# Patient Record
Sex: Female | Born: 1950 | Race: White | Hispanic: No | Smoking: Never smoker
Health system: Southern US, Community
[De-identification: ages and names within clinical notes are randomized; demographics above are authoritative.]

## PROBLEM LIST (undated history)

## (undated) DIAGNOSIS — N189 Chronic kidney disease, unspecified: Secondary | ICD-10-CM

## (undated) DIAGNOSIS — T7840XA Allergy, unspecified, initial encounter: Secondary | ICD-10-CM

## (undated) DIAGNOSIS — F419 Anxiety disorder, unspecified: Secondary | ICD-10-CM

## (undated) DIAGNOSIS — E049 Nontoxic goiter, unspecified: Secondary | ICD-10-CM

## (undated) DIAGNOSIS — R0902 Hypoxemia: Secondary | ICD-10-CM

## (undated) DIAGNOSIS — M1990 Unspecified osteoarthritis, unspecified site: Secondary | ICD-10-CM

## (undated) DIAGNOSIS — C801 Malignant (primary) neoplasm, unspecified: Secondary | ICD-10-CM

## (undated) DIAGNOSIS — I10 Essential (primary) hypertension: Secondary | ICD-10-CM

## (undated) HISTORY — PX: BREAST SURGERY: SHX581

## (undated) HISTORY — PX: ABDOMINAL HYSTERECTOMY: SHX81

## (undated) HISTORY — PX: ANTERIOR CERVICAL DECOMP/DISCECTOMY FUSION: SHX1161

## (undated) HISTORY — PX: TONSILLECTOMY: SUR1361

## (undated) HISTORY — PX: EYE SURGERY: SHX253

## (undated) HISTORY — PX: CHOLECYSTECTOMY: SHX55

## (undated) HISTORY — DX: Allergy, unspecified, initial encounter: T78.40XA

## (undated) HISTORY — PX: HERNIA REPAIR: SHX51

## (undated) HISTORY — PX: JOINT REPLACEMENT: SHX530

## (undated) HISTORY — DX: Hypoxemia: R09.02

## (undated) HISTORY — PX: SPINE SURGERY: SHX786

---

## 2003-12-29 HISTORY — PX: LAPAROSCOPIC ROUX-EN-Y GASTRIC BYPASS WITH HIATAL HERNIA REPAIR: SHX6513

## 2012-12-28 HISTORY — PX: LAPAROSCOPIC NEPHRECTOMY: SUR781

## 2014-09-28 ENCOUNTER — Ambulatory Visit: Payer: Self-pay | Admitting: Orthopedic Surgery

## 2017-06-15 ENCOUNTER — Other Ambulatory Visit: Payer: Self-pay | Admitting: Orthopedic Surgery

## 2017-06-15 DIAGNOSIS — M1712 Unilateral primary osteoarthritis, left knee: Secondary | ICD-10-CM

## 2017-06-24 ENCOUNTER — Ambulatory Visit
Admission: RE | Admit: 2017-06-24 | Discharge: 2017-06-24 | Disposition: A | Discharge status: Home / Self Care | Payer: BC Managed Care – PPO | Source: Ambulatory Visit | Attending: Orthopedic Surgery | Admitting: Orthopedic Surgery

## 2017-06-24 DIAGNOSIS — M25852 Other specified joint disorders, left hip: Secondary | ICD-10-CM | POA: Diagnosis not present

## 2017-06-24 DIAGNOSIS — M1712 Unilateral primary osteoarthritis, left knee: Secondary | ICD-10-CM

## 2017-07-07 ENCOUNTER — Encounter: Payer: Self-pay | Admitting: *Deleted

## 2017-07-07 ENCOUNTER — Encounter
Admission: RE | Admit: 2017-07-07 | Discharge: 2017-07-07 | Disposition: A | Discharge status: Home / Self Care | Payer: Medicare Other | Source: Ambulatory Visit | Attending: Orthopedic Surgery | Admitting: Orthopedic Surgery

## 2017-07-07 DIAGNOSIS — Z01810 Encounter for preprocedural cardiovascular examination: Secondary | ICD-10-CM | POA: Insufficient documentation

## 2017-07-07 DIAGNOSIS — I10 Essential (primary) hypertension: Secondary | ICD-10-CM | POA: Diagnosis present

## 2017-07-07 DIAGNOSIS — Z01812 Encounter for preprocedural laboratory examination: Secondary | ICD-10-CM | POA: Insufficient documentation

## 2017-07-07 HISTORY — DX: Nontoxic goiter, unspecified: E04.9

## 2017-07-07 HISTORY — DX: Malignant (primary) neoplasm, unspecified: C80.1

## 2017-07-07 HISTORY — DX: Chronic kidney disease, unspecified: N18.9

## 2017-07-07 HISTORY — DX: Essential (primary) hypertension: I10

## 2017-07-07 HISTORY — DX: Unspecified osteoarthritis, unspecified site: M19.90

## 2017-07-07 HISTORY — DX: Anxiety disorder, unspecified: F41.9

## 2017-07-07 LAB — BASIC METABOLIC PANEL
Anion gap: 9 (ref 5–15)
BUN: 18 mg/dL (ref 6–20)
CHLORIDE: 107 mmol/L (ref 101–111)
CO2: 24 mmol/L (ref 22–32)
CREATININE: 1.04 mg/dL — AB (ref 0.44–1.00)
Calcium: 9.3 mg/dL (ref 8.9–10.3)
GFR calc Af Amer: >60 mL/min (ref >60)
GFR calc non Af Amer: 56 mL/min — ABNORMAL LOW (ref >60)
Glucose, Bld: 84 mg/dL (ref 65–99)
Potassium: 3.5 mmol/L (ref 3.5–5.1)
Sodium: 140 mmol/L (ref 135–145)

## 2017-07-07 LAB — URINALYSIS, COMPLETE (UACMP) WITH MICROSCOPIC
Bacteria, UA: NONE SEEN
Bilirubin Urine: NEGATIVE
GLUCOSE, UA: 150 mg/dL — AB
Hgb urine dipstick: NEGATIVE
Ketones, ur: NEGATIVE mg/dL
Leukocytes, UA: NEGATIVE
Nitrite: NEGATIVE
PH: 5 (ref 5.0–8.0)
PROTEIN: NEGATIVE mg/dL
SPECIFIC GRAVITY, URINE: 1.023 (ref 1.005–1.030)

## 2017-07-07 LAB — CBC
HEMATOCRIT: 39.5 % (ref 35.0–47.0)
HEMOGLOBIN: 13.5 g/dL (ref 12.0–16.0)
MCH: 30.5 pg (ref 26.0–34.0)
MCHC: 34.3 g/dL (ref 32.0–36.0)
MCV: 89 fL (ref 80.0–100.0)
Platelets: 363 10*3/uL (ref 150–440)
RBC: 4.44 MIL/uL (ref 3.80–5.20)
RDW: 13.5 % (ref 11.5–14.5)
WBC: 6.7 10*3/uL (ref 3.6–11.0)

## 2017-07-07 LAB — SURGICAL PCR SCREEN
MRSA, PCR: NEGATIVE
Staphylococcus aureus: NEGATIVE

## 2017-07-07 LAB — PROTIME-INR
INR: 0.96
Prothrombin Time: 12.8 seconds (ref 11.4–15.2)

## 2017-07-07 LAB — SEDIMENTATION RATE: SED RATE: 23 mm/h (ref 0–30)

## 2017-07-07 LAB — TYPE AND SCREEN
ABO/RH(D): O POS
ANTIBODY SCREEN: NEGATIVE

## 2017-07-07 LAB — APTT: aPTT: 29 seconds (ref 24–36)

## 2017-07-07 NOTE — Patient Instructions (Signed)
  Your procedure is scheduled on: July 20, 2017 [facility]) Report to Same Day Surgery 2nd floor medical mall ([facility] Entrance-take elevator on left to 2nd floor.  Check in with surgery information desk.) To find out your arrival time please call [phone] between 1PM - 3PM on July 19, 2017 Monday) Remember: Instructions that are not followed completely may result in serious medical risk, up to and including death, or upon the discretion of your surgeon and anesthesiologist your surgery may need to be rescheduled.    _x___ 1. Do not eat food or drink liquids after midnight. No gum chewing or  hard candies                               __x__ 2. No Alcohol for 24 hours before or after surgery.   __x__3. No Smoking for 24 prior to surgery.   ____  4. Bring all medications with you on the day of surgery if instructed.    __x__ 5. Notify your doctor if there is any change in your medical condition     (cold, fever, infections).     Do not wear jewelry, make-up, hairpins, clips or nail polish.  Do not wear lotions, powders, or perfumes.   Do not shave 48 hours prior to surgery. Men may shave face and neck.  Do not bring valuables to the hospital.    [facility] is not responsible for any belongings or valuables.               Contacts, dentures or bridgework may not be worn into surgery.  Leave your suitcase in the car. After surgery it may be brought to your room.  For patients admitted to the hospital, discharge time is determined by your treatment team                    Patients discharged the day of surgery will not be allowed to drive home.  You will need someone to drive you home and stay with you the night of your procedure.    Please read over the following fact sheets that you were given:   [facility] Preparing for Surgery and or MRSA Information   _x___ Take the following medications the morning of surgery with a sip of water :  1. AMLODIPINE  2. BUSPIRONE  3.  LISINOPRIL  4. METOPROLOL      ____Fleets enema or Magnesium Citrate as directed.   _x___ Use CHG Soap or sage wipes as directed on instruction sheet   ____ Use inhalers on the day of surgery and bring to hospital day of surgery  ____ Stop Metformin and Janumet 2 days prior to surgery.    ____ Take 1/2 of usual insulin dose the night before surgery and none on the morning surgery      _x___ Follow recommendations from Cardiologist, Pulmonologist or PCP regarding          stopping Aspirin, Coumadin, Plavix ,Eliquis, Effient, or Pradaxa, and Pletal.  X____Stop Anti-inflammatories such as Advil, Aleve, Ibuprofen, Motrin, Naproxen, Naprosyn, Goodies powders or aspirin products. OK to take Tylenol    _x___ Stop supplements until after surgery.  But may continue Vitamin D, Vitamin B,and multivitamin          ____ Bring C-Pap to the hospital.

## 2017-07-09 LAB — URINE CULTURE: Culture: <10000 — AB

## 2017-07-19 MED ORDER — TRANEXAMIC ACID 1000 MG/10ML IV SOLN
1000.0000 mg | INTRAVENOUS | Status: DC
  Filled 2017-07-19: qty 10

## 2017-07-19 MED ORDER — CLINDAMYCIN PHOSPHATE 900 MG/50ML IV SOLN
900.0000 mg | Freq: Once | INTRAVENOUS | Status: AC
  Administered 2017-07-20: 900 mg via INTRAVENOUS

## 2017-07-20 ENCOUNTER — Inpatient Hospital Stay: Payer: Medicare Other | Admitting: Anesthesiology

## 2017-07-20 ENCOUNTER — Inpatient Hospital Stay: Payer: Medicare Other

## 2017-07-20 ENCOUNTER — Encounter
Admission: RE | Disposition: A | Discharge status: Home / Self Care | Payer: Self-pay | Source: Ambulatory Visit | Attending: Orthopedic Surgery

## 2017-07-20 ENCOUNTER — Inpatient Hospital Stay
Admission: RE | Admit: 2017-07-20 | Discharge: 2017-07-23 | DRG: 470 | Disposition: A | Discharge status: Home / Self Care | Payer: Medicare Other | Source: Ambulatory Visit | Attending: Orthopedic Surgery | Admitting: Orthopedic Surgery

## 2017-07-20 DIAGNOSIS — Z9049 Acquired absence of other specified parts of digestive tract: Secondary | ICD-10-CM | POA: Diagnosis not present

## 2017-07-20 DIAGNOSIS — Z905 Acquired absence of kidney: Secondary | ICD-10-CM | POA: Diagnosis not present

## 2017-07-20 DIAGNOSIS — Z85528 Personal history of other malignant neoplasm of kidney: Secondary | ICD-10-CM

## 2017-07-20 DIAGNOSIS — G8918 Other acute postprocedural pain: Secondary | ICD-10-CM

## 2017-07-20 DIAGNOSIS — Z981 Arthrodesis status: Secondary | ICD-10-CM | POA: Diagnosis not present

## 2017-07-20 DIAGNOSIS — M1712 Unilateral primary osteoarthritis, left knee: Principal | ICD-10-CM | POA: Diagnosis present

## 2017-07-20 DIAGNOSIS — Z825 Family history of asthma and other chronic lower respiratory diseases: Secondary | ICD-10-CM | POA: Diagnosis not present

## 2017-07-20 DIAGNOSIS — Z90710 Acquired absence of both cervix and uterus: Secondary | ICD-10-CM

## 2017-07-20 DIAGNOSIS — I129 Hypertensive chronic kidney disease with stage 1 through stage 4 chronic kidney disease, or unspecified chronic kidney disease: Secondary | ICD-10-CM | POA: Diagnosis present

## 2017-07-20 DIAGNOSIS — Z8249 Family history of ischemic heart disease and other diseases of the circulatory system: Secondary | ICD-10-CM | POA: Diagnosis not present

## 2017-07-20 DIAGNOSIS — Z880 Allergy status to penicillin: Secondary | ICD-10-CM

## 2017-07-20 DIAGNOSIS — N189 Chronic kidney disease, unspecified: Secondary | ICD-10-CM | POA: Diagnosis present

## 2017-07-20 DIAGNOSIS — Z9889 Other specified postprocedural states: Secondary | ICD-10-CM | POA: Diagnosis not present

## 2017-07-20 HISTORY — PX: TOTAL KNEE ARTHROPLASTY: SHX125

## 2017-07-20 LAB — CBC
HEMATOCRIT: 35 % (ref 35.0–47.0)
Hemoglobin: 11.9 g/dL — ABNORMAL LOW (ref 12.0–16.0)
MCH: 30.3 pg (ref 26.0–34.0)
MCHC: 34.1 g/dL (ref 32.0–36.0)
MCV: 88.8 fL (ref 80.0–100.0)
PLATELETS: 256 10*3/uL (ref 150–440)
RBC: 3.94 MIL/uL (ref 3.80–5.20)
RDW: 13.3 % (ref 11.5–14.5)
WBC: 11 10*3/uL (ref 3.6–11.0)

## 2017-07-20 LAB — CREATININE, SERUM
Creatinine, Ser: 0.9 mg/dL (ref 0.44–1.00)
GFR calc Af Amer: >60 mL/min (ref >60)
GFR calc non Af Amer: >60 mL/min (ref >60)

## 2017-07-20 LAB — ABO/RH: ABO/RH(D): O POS

## 2017-07-20 SURGERY — ARTHROPLASTY, KNEE, TOTAL
Anesthesia: Spinal | Site: Knee | Laterality: Left | Wound class: Clean

## 2017-07-20 MED ORDER — MORPHINE SULFATE 10 MG/ML IJ SOLN
INTRAMUSCULAR | Status: DC | PRN
Start: 2017-07-20 — End: 2017-07-20
  Administered 2017-07-20: 10 mg

## 2017-07-20 MED ORDER — LISINOPRIL 10 MG PO TABS
10.0000 mg | ORAL_TABLET | Freq: Every day | ORAL | Status: DC
  Administered 2017-07-21 – 2017-07-22 (×2): 10 mg via ORAL
  Filled 2017-07-20 (×2): qty 1

## 2017-07-20 MED ORDER — DIPHENHYDRAMINE HCL 12.5 MG/5ML PO ELIX: 12.5000 mg | ORAL_SOLUTION | ORAL | Status: DC | PRN

## 2017-07-20 MED ORDER — LIDOCAINE HCL (PF) 2 % IJ SOLN
INTRAMUSCULAR | Status: AC
  Filled 2017-07-20: qty 2

## 2017-07-20 MED ORDER — ACETAMINOPHEN 650 MG RE SUPP: 650.0000 mg | Freq: Four times a day (QID) | RECTAL | Status: DC | PRN

## 2017-07-20 MED ORDER — SODIUM CHLORIDE 0.9 % IV BOLUS (SEPSIS): 500.0000 mL | Freq: Once | INTRAVENOUS | Status: DC

## 2017-07-20 MED ORDER — DOCUSATE SODIUM 100 MG PO CAPS
100.0000 mg | ORAL_CAPSULE | Freq: Two times a day (BID) | ORAL | Status: DC
  Administered 2017-07-20 – 2017-07-23 (×6): 100 mg via ORAL
  Filled 2017-07-20 (×6): qty 1

## 2017-07-20 MED ORDER — METOPROLOL SUCCINATE ER 50 MG PO TB24
50.0000 mg | ORAL_TABLET | Freq: Two times a day (BID) | ORAL | Status: DC
  Administered 2017-07-20 – 2017-07-22 (×5): 50 mg via ORAL
  Filled 2017-07-20 (×5): qty 1

## 2017-07-20 MED ORDER — METOCLOPRAMIDE HCL 10 MG PO TABS: 5.0000 mg | ORAL_TABLET | Freq: Three times a day (TID) | ORAL | Status: DC | PRN

## 2017-07-20 MED ORDER — OXYCODONE HCL 5 MG PO TABS
5.0000 mg | ORAL_TABLET | ORAL | Status: DC | PRN
  Administered 2017-07-20: 10 mg via ORAL
  Administered 2017-07-20 (×2): 5 mg via ORAL
  Administered 2017-07-20 – 2017-07-21 (×2): 10 mg via ORAL
  Administered 2017-07-21: 5 mg via ORAL
  Administered 2017-07-21 – 2017-07-23 (×10): 10 mg via ORAL
  Filled 2017-07-20 (×5): qty 2
  Filled 2017-07-20: qty 1
  Filled 2017-07-20 (×3): qty 2
  Filled 2017-07-20: qty 1
  Filled 2017-07-20 (×5): qty 2
  Filled 2017-07-20: qty 1

## 2017-07-20 MED ORDER — BUPIVACAINE LIPOSOME 1.3 % IJ SUSP
INTRAMUSCULAR | Status: AC
  Filled 2017-07-20: qty 20

## 2017-07-20 MED ORDER — BISACODYL 10 MG RE SUPP: 10.0000 mg | Freq: Every day | RECTAL | Status: DC | PRN

## 2017-07-20 MED ORDER — SODIUM CHLORIDE 0.9 % IJ SOLN
INTRAMUSCULAR | Status: AC
  Filled 2017-07-20: qty 50

## 2017-07-20 MED ORDER — NEOMYCIN-POLYMYXIN B GU 40-200000 IR SOLN
Status: DC | PRN
  Administered 2017-07-20: 14 mL

## 2017-07-20 MED ORDER — MENTHOL 3 MG MT LOZG
1.0000 | LOZENGE | OROMUCOSAL | Status: DC | PRN
  Filled 2017-07-20: qty 9

## 2017-07-20 MED ORDER — SODIUM CHLORIDE 0.9 % IV SOLN
INTRAVENOUS | Status: DC
  Administered 2017-07-20 – 2017-07-21 (×2): via INTRAVENOUS

## 2017-07-20 MED ORDER — ZOLPIDEM TARTRATE 5 MG PO TABS
5.0000 mg | ORAL_TABLET | Freq: Every evening | ORAL | Status: DC | PRN
Start: 2017-07-20 — End: 2017-07-23
  Administered 2017-07-22: 5 mg via ORAL
  Filled 2017-07-20: qty 1

## 2017-07-20 MED ORDER — MORPHINE SULFATE (PF) 10 MG/ML IV SOLN
INTRAVENOUS | Status: AC
  Filled 2017-07-20: qty 1

## 2017-07-20 MED ORDER — ONDANSETRON HCL 4 MG PO TABS
4.0000 mg | ORAL_TABLET | Freq: Four times a day (QID) | ORAL | Status: DC | PRN
  Administered 2017-07-22 – 2017-07-23 (×3): 4 mg via ORAL
  Filled 2017-07-20 (×3): qty 1

## 2017-07-20 MED ORDER — METOCLOPRAMIDE HCL 5 MG/ML IJ SOLN: 5.0000 mg | Freq: Three times a day (TID) | INTRAMUSCULAR | Status: DC | PRN

## 2017-07-20 MED ORDER — ONDANSETRON HCL 4 MG/2ML IJ SOLN
INTRAMUSCULAR | Status: AC
  Filled 2017-07-20: qty 2

## 2017-07-20 MED ORDER — PNEUMOCOCCAL VAC POLYVALENT 25 MCG/0.5ML IJ INJ: 0.5000 mL | INJECTION | INTRAMUSCULAR | Status: DC

## 2017-07-20 MED ORDER — BUSPIRONE HCL 10 MG PO TABS
10.0000 mg | ORAL_TABLET | Freq: Two times a day (BID) | ORAL | Status: DC
  Administered 2017-07-20 – 2017-07-22 (×5): 10 mg via ORAL
  Filled 2017-07-20 (×5): qty 1

## 2017-07-20 MED ORDER — FENTANYL CITRATE (PF) 100 MCG/2ML IJ SOLN
INTRAMUSCULAR | Status: AC
  Filled 2017-07-20: qty 2

## 2017-07-20 MED ORDER — METHOCARBAMOL 1000 MG/10ML IJ SOLN
500.0000 mg | Freq: Four times a day (QID) | INTRAVENOUS | Status: DC | PRN
  Filled 2017-07-20: qty 5

## 2017-07-20 MED ORDER — FAMOTIDINE 20 MG PO TABS
20.0000 mg | ORAL_TABLET | Freq: Once | ORAL | Status: AC
  Administered 2017-07-20: 20 mg via ORAL

## 2017-07-20 MED ORDER — BUPIVACAINE HCL (PF) 0.5 % IJ SOLN
INTRAMUSCULAR | Status: AC
  Filled 2017-07-20: qty 10

## 2017-07-20 MED ORDER — CLINDAMYCIN PHOSPHATE 900 MG/50ML IV SOLN
900.0000 mg | Freq: Four times a day (QID) | INTRAVENOUS | Status: AC
  Administered 2017-07-20 – 2017-07-21 (×4): 900 mg via INTRAVENOUS
  Filled 2017-07-20 (×5): qty 50

## 2017-07-20 MED ORDER — FENTANYL CITRATE (PF) 100 MCG/2ML IJ SOLN
INTRAMUSCULAR | Status: DC | PRN
  Administered 2017-07-20 (×2): 50 ug via INTRAVENOUS

## 2017-07-20 MED ORDER — BUPIVACAINE-EPINEPHRINE (PF) 0.25% -1:200000 IJ SOLN
INTRAMUSCULAR | Status: DC | PRN
  Administered 2017-07-20: 30 mL

## 2017-07-20 MED ORDER — PROPOFOL 500 MG/50ML IV EMUL
INTRAVENOUS | Status: DC | PRN
  Administered 2017-07-20: 75 ug/kg/min via INTRAVENOUS

## 2017-07-20 MED ORDER — CLINDAMYCIN PHOSPHATE 900 MG/50ML IV SOLN
INTRAVENOUS | Status: AC
Start: 2017-07-20 — End: 2017-07-20
  Filled 2017-07-20: qty 50

## 2017-07-20 MED ORDER — AMLODIPINE BESYLATE 5 MG PO TABS
5.0000 mg | ORAL_TABLET | Freq: Two times a day (BID) | ORAL | Status: DC
  Administered 2017-07-20 – 2017-07-22 (×5): 5 mg via ORAL
  Filled 2017-07-20 (×5): qty 1

## 2017-07-20 MED ORDER — TRAMADOL HCL 50 MG PO TABS
50.0000 mg | ORAL_TABLET | Freq: Four times a day (QID) | ORAL | Status: DC | PRN
  Administered 2017-07-20: 50 mg via ORAL
  Filled 2017-07-20: qty 1

## 2017-07-20 MED ORDER — ONDANSETRON HCL 4 MG/2ML IJ SOLN
4.0000 mg | Freq: Four times a day (QID) | INTRAMUSCULAR | Status: DC | PRN
  Administered 2017-07-21 – 2017-07-22 (×2): 4 mg via INTRAVENOUS
  Filled 2017-07-20 (×2): qty 2

## 2017-07-20 MED ORDER — MIDAZOLAM HCL 5 MG/5ML IJ SOLN
INTRAMUSCULAR | Status: DC | PRN
  Administered 2017-07-20 (×2): 1 mg via INTRAVENOUS

## 2017-07-20 MED ORDER — LACTATED RINGERS IV SOLN
INTRAVENOUS | Status: DC
  Administered 2017-07-20: 10:00:00 via INTRAVENOUS

## 2017-07-20 MED ORDER — ONDANSETRON HCL 4 MG/2ML IJ SOLN
4.0000 mg | Freq: Once | INTRAMUSCULAR | Status: AC | PRN
  Administered 2017-07-20: 4 mg via INTRAVENOUS

## 2017-07-20 MED ORDER — TRAMADOL HCL 50 MG PO TABS
50.0000 mg | ORAL_TABLET | Freq: Four times a day (QID) | ORAL | Status: DC | PRN
  Administered 2017-07-21 – 2017-07-22 (×4): 100 mg via ORAL
  Filled 2017-07-20 (×4): qty 2

## 2017-07-20 MED ORDER — MAGNESIUM HYDROXIDE 400 MG/5ML PO SUSP
30.0000 mL | Freq: Every day | ORAL | Status: DC | PRN
  Administered 2017-07-23: 30 mL via ORAL
  Filled 2017-07-20: qty 30

## 2017-07-20 MED ORDER — DEXAMETHASONE SODIUM PHOSPHATE 10 MG/ML IJ SOLN
INTRAMUSCULAR | Status: AC
  Filled 2017-07-20: qty 1

## 2017-07-20 MED ORDER — ENOXAPARIN SODIUM 30 MG/0.3ML ~~LOC~~ SOLN
30.0000 mg | Freq: Two times a day (BID) | SUBCUTANEOUS | Status: DC
  Administered 2017-07-21 – 2017-07-23 (×5): 30 mg via SUBCUTANEOUS
  Filled 2017-07-20 (×5): qty 0.3

## 2017-07-20 MED ORDER — DEXAMETHASONE SODIUM PHOSPHATE 10 MG/ML IJ SOLN
INTRAMUSCULAR | Status: DC | PRN
  Administered 2017-07-20: 8 mg via INTRAVENOUS

## 2017-07-20 MED ORDER — ONDANSETRON HCL 4 MG/2ML IJ SOLN: INTRAMUSCULAR | Status: DC | PRN

## 2017-07-20 MED ORDER — SODIUM CHLORIDE 0.9 % IV SOLN
INTRAVENOUS | Status: DC | PRN
  Administered 2017-07-20: 20 ug/min via INTRAVENOUS

## 2017-07-20 MED ORDER — BUPIVACAINE HCL (PF) 0.25 % IJ SOLN
INTRAMUSCULAR | Status: AC
  Filled 2017-07-20: qty 30

## 2017-07-20 MED ORDER — PROPOFOL 10 MG/ML IV BOLUS
INTRAVENOUS | Status: AC
  Filled 2017-07-20: qty 20

## 2017-07-20 MED ORDER — EPINEPHRINE PF 1 MG/ML IJ SOLN
INTRAMUSCULAR | Status: AC
  Filled 2017-07-20: qty 2

## 2017-07-20 MED ORDER — MIDAZOLAM HCL 2 MG/2ML IJ SOLN
INTRAMUSCULAR | Status: AC
  Filled 2017-07-20: qty 2

## 2017-07-20 MED ORDER — PROPOFOL 10 MG/ML IV BOLUS
INTRAVENOUS | Status: DC | PRN
  Administered 2017-07-20 (×2): 20 mg via INTRAVENOUS

## 2017-07-20 MED ORDER — PROPOFOL 500 MG/50ML IV EMUL
INTRAVENOUS | Status: AC
  Filled 2017-07-20: qty 50

## 2017-07-20 MED ORDER — NEOMYCIN-POLYMYXIN B GU 40-200000 IR SOLN
Status: AC
  Filled 2017-07-20: qty 20

## 2017-07-20 MED ORDER — ACETAMINOPHEN 325 MG PO TABS: 650.0000 mg | ORAL_TABLET | Freq: Four times a day (QID) | ORAL | Status: DC | PRN

## 2017-07-20 MED ORDER — PHENOL 1.4 % MT LIQD
1.0000 | OROMUCOSAL | Status: DC | PRN
  Filled 2017-07-20: qty 177

## 2017-07-20 MED ORDER — METHOCARBAMOL 500 MG PO TABS
500.0000 mg | ORAL_TABLET | Freq: Four times a day (QID) | ORAL | Status: DC | PRN
  Administered 2017-07-20 – 2017-07-23 (×4): 500 mg via ORAL
  Filled 2017-07-20 (×4): qty 1

## 2017-07-20 MED ORDER — ONDANSETRON HCL 4 MG/2ML IJ SOLN
INTRAMUSCULAR | Status: DC | PRN
  Administered 2017-07-20: 4 mg via INTRAVENOUS

## 2017-07-20 MED ORDER — FAMOTIDINE 20 MG PO TABS
ORAL_TABLET | ORAL | Status: AC
  Administered 2017-07-20: 20 mg via ORAL
  Filled 2017-07-20: qty 1

## 2017-07-20 MED ORDER — SODIUM CHLORIDE 0.9 % IV SOLN
INTRAVENOUS | Status: DC | PRN
  Administered 2017-07-20: 60 mL

## 2017-07-20 MED ORDER — FENTANYL CITRATE (PF) 100 MCG/2ML IJ SOLN: 25.0000 ug | INTRAMUSCULAR | Status: DC | PRN

## 2017-07-20 MED ORDER — ALUM & MAG HYDROXIDE-SIMETH 200-200-20 MG/5ML PO SUSP
30.0000 mL | ORAL | Status: DC | PRN
Start: 2017-07-20 — End: 2017-07-23

## 2017-07-20 MED ORDER — PHENYLEPHRINE HCL 10 MG/ML IJ SOLN
INTRAMUSCULAR | Status: AC
  Filled 2017-07-20: qty 1

## 2017-07-20 MED ORDER — MORPHINE SULFATE (PF) 2 MG/ML IV SOLN
2.0000 mg | INTRAVENOUS | Status: DC | PRN
  Administered 2017-07-20 – 2017-07-21 (×3): 2 mg via INTRAVENOUS
  Filled 2017-07-20 (×3): qty 1

## 2017-07-20 MED ORDER — MAGNESIUM CITRATE PO SOLN
1.0000 | Freq: Once | ORAL | Status: DC | PRN
  Filled 2017-07-20: qty 296

## 2017-07-20 MED ORDER — PHENYLEPHRINE HCL 10 MG/ML IJ SOLN
INTRAMUSCULAR | Status: DC | PRN
  Administered 2017-07-20 (×4): 100 ug via INTRAVENOUS

## 2017-07-20 SURGICAL SUPPLY — 59 items
BANDAGE ACE 6X5 VEL STRL LF (GAUZE/BANDAGES/DRESSINGS) ×3 IMPLANT
BLADE SAW 1 (BLADE) ×3 IMPLANT
BLOCK CUTTING TIBIAL 4 LT CT (MISCELLANEOUS) IMPLANT
CANISTER SUCT 1200ML W/VALVE (MISCELLANEOUS) ×3 IMPLANT
CANISTER SUCT 3000ML PPV (MISCELLANEOUS) ×6 IMPLANT
CAPT KNEE TOTAL 3 ×3 IMPLANT
CATH FOL LEG HOLDER (MISCELLANEOUS) ×3 IMPLANT
CATH TRAY METER 16FR LF (MISCELLANEOUS) ×3 IMPLANT
CEMENT HV SMART SET (Cement) ×6 IMPLANT
CHLORAPREP W/TINT 26ML (MISCELLANEOUS) ×6 IMPLANT
COOLER POLAR GLACIER W/PUMP (MISCELLANEOUS) ×3 IMPLANT
CUFF TOURN 24 STER (MISCELLANEOUS) IMPLANT
CUFF TOURN 30 STER DUAL PORT (MISCELLANEOUS) ×3 IMPLANT
DRAPE SHEET LG 3/4 BI-LAMINATE (DRAPES) ×6 IMPLANT
ELECT CAUTERY BLADE 6.4 (BLADE) ×3 IMPLANT
ELECT REM PT RETURN 9FT ADLT (ELECTROSURGICAL) ×3
ELECTRODE REM PT RTRN 9FT ADLT (ELECTROSURGICAL) ×1 IMPLANT
GAUZE PETRO XEROFOAM 1X8 (MISCELLANEOUS) ×3 IMPLANT
GAUZE SPONGE 4X4 12PLY STRL (GAUZE/BANDAGES/DRESSINGS) ×3 IMPLANT
GLOVE BIOGEL PI IND STRL 9 (GLOVE) ×1 IMPLANT
GLOVE BIOGEL PI INDICATOR 9 (GLOVE) ×2
GLOVE INDICATOR 8.0 STRL GRN (GLOVE) ×3 IMPLANT
GLOVE SURG ORTHO 8.0 STRL STRW (GLOVE) ×3 IMPLANT
GLOVE SURG SYN 9.0  PF PI (GLOVE) ×2
GLOVE SURG SYN 9.0 PF PI (GLOVE) ×1 IMPLANT
GOWN SRG 2XL LVL 4 RGLN SLV (GOWNS) ×1 IMPLANT
GOWN STRL NON-REIN 2XL LVL4 (GOWNS) ×2
GOWN STRL REUS W/ TWL LRG LVL3 (GOWN DISPOSABLE) ×1 IMPLANT
GOWN STRL REUS W/ TWL XL LVL3 (GOWN DISPOSABLE) ×1 IMPLANT
GOWN STRL REUS W/TWL LRG LVL3 (GOWN DISPOSABLE) ×2
GOWN STRL REUS W/TWL XL LVL3 (GOWN DISPOSABLE) ×2
HOOD PEEL AWAY FLYTE STAYCOOL (MISCELLANEOUS) ×6 IMPLANT
IMMBOLIZER KNEE 19 BLUE UNIV (SOFTGOODS) ×3 IMPLANT
KIT RM TURNOVER STRD PROC AR (KITS) ×3 IMPLANT
KNEE MEDACTA TIBIAL/FEMORAL BL (Knees) ×3 IMPLANT
KNIFE SCULPS 14X20 (INSTRUMENTS) ×3 IMPLANT
MEDACTA FEMUR CUTTING BLOCK IMPLANT
NDL SAFETY 18GX1.5 (NEEDLE) ×3 IMPLANT
NEEDLE SPNL 18GX3.5 QUINCKE PK (NEEDLE) ×3 IMPLANT
NEEDLE SPNL 20GX3.5 QUINCKE YW (NEEDLE) ×3 IMPLANT
NS IRRIG 1000ML POUR BTL (IV SOLUTION) ×3 IMPLANT
PACK TOTAL KNEE (MISCELLANEOUS) ×3 IMPLANT
PAD WRAPON POLAR KNEE (MISCELLANEOUS) ×1 IMPLANT
PULSAVAC PLUS IRRIG FAN TIP (DISPOSABLE) ×3
SOL .9 NS 3000ML IRR  AL (IV SOLUTION) ×2
SOL .9 NS 3000ML IRR UROMATIC (IV SOLUTION) ×1 IMPLANT
STAPLER SKIN PROX 35W (STAPLE) ×3 IMPLANT
SUCTION FRAZIER HANDLE 10FR (MISCELLANEOUS) ×2
SUCTION TUBE FRAZIER 10FR DISP (MISCELLANEOUS) ×1 IMPLANT
SUT DVC 2 QUILL PDO  T11 36X36 (SUTURE) ×2
SUT DVC 2 QUILL PDO T11 36X36 (SUTURE) ×1 IMPLANT
SUT V-LOC 90 ABS DVC 3-0 CL (SUTURE) ×3 IMPLANT
SYR 20CC LL (SYRINGE) ×3 IMPLANT
SYR 50ML LL SCALE MARK (SYRINGE) ×6 IMPLANT
TIBIAL BONE MODEL LEFT (MISCELLANEOUS) IMPLANT
TIP FAN IRRIG PULSAVAC PLUS (DISPOSABLE) ×1 IMPLANT
TOWEL OR 17X26 4PK STRL BLUE (TOWEL DISPOSABLE) ×3 IMPLANT
TOWER CARTRIDGE SMART MIX (DISPOSABLE) ×3 IMPLANT
WRAPON POLAR PAD KNEE (MISCELLANEOUS) ×3

## 2017-07-20 NOTE — Progress Notes (Signed)
RN in room. Pt BP at 1500 137/64 pulse 80. Held normal saline bolus for now. Pt given oxycodone for pain now that BP's are more stable. Will continue to monitor.

## 2017-07-20 NOTE — Progress Notes (Signed)
Dr. Rudene Christians on floor seeing pt's. MD went in to see patient. Rept to MD what pt's BP's are now and that 500 cc normal saline bolus held. Rept to MD that now BP's were up, pt given oxycodone for pain. No new orders at this time. Will continue to monitor.

## 2017-07-20 NOTE — Op Note (Signed)
07/20/2017  12:24 PM  PATIENT:  Caroline Vazquez  66 y.o. female  PRE-OPERATIVE DIAGNOSIS:  PRIMARY OSTEOARTHRITIS LEFT KNEE  POST-OPERATIVE DIAGNOSIS:  PRIMARY OSTEOARTHRITIS LEFT KNEE  PROCEDURE:  Procedure(s): TOTAL KNEE ARTHROPLASTY (Left)  SURGEON: Laurene Footman, MD  ASSISTANTS: Rachelle Hora [facility]  ANESTHESIA:   spinal  EBL:  Total I/O In: 1300 [I.V.:1300] Out: 275 [Urine:175; Blood:100]  BLOOD ADMINISTERED:none  DRAINS: none   LOCAL MEDICATIONS USED:  MARCAINE    and OTHER morphine and Exparel  SPECIMEN:  No Specimen  DISPOSITION OF SPECIMEN:  N/A  COUNTS:  YES  TOURNIQUET:  55 minutes at 300 mmHg   IMPLANTS:  Medacta GMK sphere left 4+ femur, 4 tibia was short stem and 12 mm insert and 2 patella all components cemented  DICTATION: .Dragon Dictation  patient brought the operating room and after adequate anesthesia was obtained the left leg was prepped and draped in sterile fashion. After patient identification and timeout procedure were completed a midline skin incision was made followed by medial parapatellar arthrotomy. The anterior cruciate ligament was deficient there was extensive wear of all 3 compartments with exposed bone in all compartments. The proximal tibia was exposed for application of the Medacta my knee block and the proximal tibia cut was carried out followed by excision of the medial meniscus andl lateral meniscus. The distal femoral exposure was carried out and similar fashion with the Medacta my knee cutting guide placed and the distal cut carried out. The 4+ cutting guide was applied anterior posterior and chamfer cuts made followed by the notch cut for the femur. Approximately tibia is then pulled forward and the tibial baseplate trial centered and rotation based off of a pin left from the initial cutting block. Proximal tibial preparation was carried out with drill hand reaming and then keel punch. With the femoral trial in place 12 mm insert gave  excellent stability with correction varus deformity and flexion contracture that was present at the start of the case. There is good flexion stability and mid flexion stability as well. Distal femoral drill holes were made this point with the trochlear groove cut being performed. All trials were then removed and the patella cut with the patellar cutting guide drilled and measured a size 2.  The local anesthetic noted above was infiltrated at this time and the tourniquet then again raised. Bony surfaces thoroughly irrigated and dried. Tibial components cemented in place first with excess cement removed followed by placement of the polyethylene insert and torque screwdriver used to tighten the set screw. Distal femoral component was impacted into place and the knee held in extension. Patella button was clamped into place again with cement being utilized and excess cement removed. After the cemented set excess cement was removed and the patella tracked well. Tourniquet  was let down and the knee was thoroughly irrigated and arthrotomy repaired using  the heavy Quill, 3-0 v-loc subcutaneously followed by skin staples. Xeroform 4 x 4 web roll and Ace wrap applied along with Polar Care  PLAN OF CARE: Admit to inpatient   PATIENT DISPOSITION:  PACU - hemodynamically stable.

## 2017-07-20 NOTE — Transfer of Care (Signed)
Immediate Anesthesia Transfer of Care Note  Patient: Caroline Vazquez  Procedure(s) Performed: Procedure(s): TOTAL KNEE ARTHROPLASTY (Left)  Patient Location: PACU  Anesthesia Type:Spinal  Level of Consciousness: awake  Airway & Oxygen Therapy: Patient Spontanous Breathing and Patient connected to face mask oxygen  Post-op Assessment: Report given to RN and Post -op Vital signs reviewed and stable  Post vital signs: Reviewed and stable  Last Vitals:  Vitals:   07/20/17 0922 07/20/17 1226  BP: (!) 144/71 102/72  Pulse: 74 92  Resp: 18 11  Temp: 36.8 C 36.7 C    Last Pain:  Vitals:   07/20/17 0922  TempSrc: Oral         Complications: No apparent anesthesia complications

## 2017-07-20 NOTE — Anesthesia Procedure Notes (Signed)
Spinal  Patient location during procedure: OR Staffing Performed: anesthesiologist  Preanesthetic Checklist Completed: patient identified, site marked, surgical consent, pre-op evaluation, timeout performed, IV checked, risks and benefits discussed and monitors and equipment checked Spinal Block Patient position: sitting Prep: Betadine Patient monitoring: heart rate, continuous pulse ox, blood pressure and cardiac monitor Approach: midline Location: L4-5 Injection technique: single-shot Needle Needle type: Whitacre and Introducer  Needle gauge: 22 G Needle length: 9 cm Assessment Sensory level: T4 Events: paresthesia Additional Notes Multiple attempts with difficult procedure secondary to scoliosis and deep access to it space. Three sites used and at L4L5 Negative paresthesia. Fleeting paresthesia at 1st attempt with spontaneous resolution. Negative blood return. Positive free-flowing CSF. Expiration date of kit checked and confirmed. Patient tolerated procedure well, without complications.

## 2017-07-20 NOTE — Progress Notes (Signed)
Rept to Dr. Rudene Christians. Pt's block is wearing off and she is c/o pain of L knee. Pt's BP in PACU was running 90/50's. Per his order, will administer 500 cc normal saline bolus IV and utilize tramadol for pain at this time due to low BP's. Will continue to monitor.

## 2017-07-20 NOTE — H&P (Signed)
Reviewed paper H+P, will be scanned into chart. No changes noted.  

## 2017-07-20 NOTE — Anesthesia Procedure Notes (Signed)
Date/Time: 07/20/2017 11:10 AM Performed by: Allean Found Pre-anesthesia Checklist: Patient identified, Emergency Drugs available, Suction available, Patient being monitored and Timeout performed Patient Re-evaluated:Patient Re-evaluated prior to induction Oxygen Delivery Method: Nasal cannula Placement Confirmation: positive ETCO2 Dental Injury: Teeth and Oropharynx as per pre-operative assessment

## 2017-07-20 NOTE — Anesthesia Preprocedure Evaluation (Signed)
Anesthesia Evaluation  Patient identified by MRN, date of birth, ID band Patient awake    Reviewed: Allergy & Precautions, H&P , NPO status , Patient's Chart, lab work & pertinent test results, reviewed documented beta blocker date and time   Airway Mallampati: II   Neck ROM: full    Dental  (+) Poor Dentition   Pulmonary neg pulmonary ROS,    Pulmonary exam normal        Cardiovascular hypertension, negative cardio ROS Normal cardiovascular exam Rhythm:regular Rate:Normal     Neuro/Psych negative neurological ROS  negative psych ROS   GI/Hepatic negative GI ROS, Neg liver ROS,   Endo/Other  negative endocrine ROS  Renal/GU Renal diseasenegative Renal ROS  negative genitourinary   Musculoskeletal   Abdominal   Peds  Hematology negative hematology ROS (+)   Anesthesia Other Findings Past Medical History: No date: Anxiety No date: Arthritis No date: Cancer [facility])     Comment:  Right Kidney No date: Chronic kidney disease No date: Goiter, nodular No date: Hypertension Past Surgical History: No date: ABDOMINAL HYSTERECTOMY No date: ANTERIOR CERVICAL DECOMP/DISCECTOMY FUSION No date: BREAST SURGERY; Bilateral No date: CHOLECYSTECTOMY No date: HERNIA REPAIR 2014: LAPAROSCOPIC NEPHRECTOMY; Right     Comment:  [facility], [place] 2005: LAPAROSCOPIC ROUX-EN-Y GASTRIC BYPASS WITH HIATAL HERNIA REPAIR No date: TONSILLECTOMY   Reproductive/Obstetrics negative OB ROS                             Anesthesia Physical Anesthesia Plan  ASA: III  Anesthesia Plan: Spinal   Post-op Pain Management:    Induction:   PONV Risk Score and Plan: 4 or greater and Ondansetron, Dexamethasone, Propofol and Midazolam  Airway Management Planned:   Additional Equipment:   Intra-op Plan:   Post-operative Plan:   Informed Consent: I have reviewed the patients History and Physical, chart, labs and  discussed the procedure including the risks, benefits and alternatives for the proposed anesthesia with the patient or authorized representative who has indicated his/her understanding and acceptance.   Dental Advisory Given  Plan Discussed with: CRNA  Anesthesia Plan Comments:         Anesthesia Quick Evaluation

## 2017-07-20 NOTE — Anesthesia Post-op Follow-up Note (Cosign Needed)
Anesthesia QCDR form completed.        

## 2017-07-20 NOTE — Progress Notes (Signed)
PT Cancellation Note  Patient Details Name: Caroline Vazquez MRN: [id number] DOB: November 07, 1951   Cancelled Treatment:    Reason Eval/Treat Not Completed: Pain limiting ability to participate.  PT consult received.  Chart reviewed.  Pt reporting spinal block starting to wear off and was too uncomfortable (in regards to pain) to participate in physical therapy evaluation.  Nursing aware of pt's pain and reports pt with h/o low BP today which complicated ability to give pain meds.  Discussed with pt dangling edge of bed later this evening with nursing staff when pain was more under control.  Will re-attempt PT eval at a later date/time.  Leitha Bleak, PT 07/20/17, 3:24 PM [phone]

## 2017-07-20 NOTE — NC FL2 (Signed)
[place] LEVEL OF CARE SCREENING TOOL     IDENTIFICATION  Patient Name: Caroline Vazquez Birthdate: 1951/02/29 Sex: female Admission Date (Current Location): 07/20/2017  [place] and [place] Number:  Engineering geologist and Address:  [facility], [address] [place], [place] [address]      Provider Number: [id number]  Attending Physician Name and Address:  Hessie Knows, MD  Relative Name and Phone Number:       Current Level of Care: Hospital Recommended Level of Care: [place] Prior Approval Number:    Date Approved/Denied:   PASRR Number:  [id number])  Discharge Plan: SNF    Current Diagnoses: Patient Active Problem List   Diagnosis Date Noted  . Primary localized osteoarthritis of left knee 07/20/2017    Orientation RESPIRATION BLADDER Height & Weight     Self, Time, Situation, Place  Normal Continent Weight: 220 lb (99.8 kg) Height:  5\' 10"  (177.8 cm)  BEHAVIORAL SYMPTOMS/MOOD NEUROLOGICAL BOWEL NUTRITION STATUS   (none)  (none) Continent Diet (Regular Diet )  AMBULATORY STATUS COMMUNICATION OF NEEDS Skin   Extensive Assist Verbally Surgical wounds (Incision: Left Knee )                       Personal Care Assistance Level of Assistance  Bathing, Feeding, Dressing Bathing Assistance: Limited assistance Feeding assistance: Independent Dressing Assistance: Limited assistance     Functional Limitations Info  Sight, Hearing, Speech Sight Info: Adequate Hearing Info: Adequate Speech Info: Adequate    SPECIAL CARE FACTORS FREQUENCY  PT (By licensed PT), OT (By licensed OT)     PT Frequency:  (5) OT Frequency:  (5)            Contractures      Additional Factors Info  Code Status, Allergies Code Status Info:  (Full Code. ) Allergies Info:  (Penicillins, Nsaids)           Current Medications (07/20/2017):  This is the current hospital active medication list Current  Facility-Administered Medications  Medication Dose Route Frequency Provider Last Rate Last Dose  . 0.9 %  sodium chloride infusion   Intravenous Continuous Hessie Knows, MD 75 mL/hr at 07/20/17 1445    . acetaminophen (TYLENOL) tablet 650 mg  650 mg Oral Q6H PRN Hessie Knows, MD       Or  . acetaminophen (TYLENOL) suppository 650 mg  650 mg Rectal Q6H PRN Hessie Knows, MD      . alum & mag hydroxide-simeth (MAALOX/MYLANTA) 200-200-20 MG/5ML suspension 30 mL  30 mL Oral Q4H PRN Hessie Knows, MD      . amLODipine (NORVASC) tablet 5 mg  5 mg Oral BID Hessie Knows, MD      . bisacodyl (DULCOLAX) suppository 10 mg  10 mg Rectal Daily PRN Hessie Knows, MD      . busPIRone (BUSPAR) tablet 10 mg  10 mg Oral BID Hessie Knows, MD      . clindamycin (CLEOCIN) IVPB 900 mg  900 mg Intravenous Q6H Hessie Knows, MD      . diphenhydrAMINE (BENADRYL) 12.5 MG/5ML elixir 12.5-25 mg  12.5-25 mg Oral Q4H PRN Hessie Knows, MD      . docusate sodium (COLACE) capsule 100 mg  100 mg Oral BID Hessie Knows, MD      . Derrill Memo ON 07/21/2017] enoxaparin (LOVENOX) injection 30 mg  30 mg Subcutaneous Q12H Hessie Knows, MD      . Derrill Memo ON  07/21/2017] lisinopril (PRINIVIL,ZESTRIL) tablet 10 mg  10 mg Oral Daily Hessie Knows, MD      . magnesium citrate solution 1 Bottle  1 Bottle Oral Once PRN Hessie Knows, MD      . magnesium hydroxide (MILK OF MAGNESIA) suspension 30 mL  30 mL Oral Daily PRN Hessie Knows, MD      . menthol-cetylpyridinium (CEPACOL) lozenge 3 mg  1 lozenge Oral PRN Hessie Knows, MD       Or  . phenol (CHLORASEPTIC) mouth spray 1 spray  1 spray Mouth/Throat PRN Hessie Knows, MD      . methocarbamol (ROBAXIN) tablet 500 mg  500 mg Oral Q6H PRN Hessie Knows, MD       Or  . methocarbamol (ROBAXIN) 500 mg in dextrose 5 % 50 mL IVPB  500 mg Intravenous Q6H PRN Hessie Knows, MD      . metoCLOPramide (REGLAN) tablet 5-10 mg  5-10 mg Oral Q8H PRN Hessie Knows, MD       Or  . metoCLOPramide (REGLAN)  injection 5-10 mg  5-10 mg Intravenous Q8H PRN Hessie Knows, MD      . metoprolol succinate (TOPROL-XL) 24 hr tablet 50 mg  50 mg Oral BID Hessie Knows, MD      . morphine 2 MG/ML injection 2 mg  2 mg Intravenous Q1H PRN Hessie Knows, MD      . ondansetron [facility]) 4 MG/2ML injection           . ondansetron (ZOFRAN) tablet 4 mg  4 mg Oral Q6H PRN Hessie Knows, MD       Or  . ondansetron [facility]) injection 4 mg  4 mg Intravenous Q6H PRN Hessie Knows, MD      . oxyCODONE (Oxy IR/ROXICODONE) immediate release tablet 5-10 mg  5-10 mg Oral Q3H PRN Hessie Knows, MD   5 mg at 07/20/17 1535  . [START ON 07/21/2017] pneumococcal 23 valent vaccine (PNU-IMMUNE) injection 0.5 mL  0.5 mL Intramuscular Tomorrow-1000 Hessie Knows, MD      . sodium chloride 0.9 % bolus 500 mL  500 mL Intravenous Once Hessie Knows, MD      . traMADol Veatrice Bourbon) tablet 50-100 mg  50-100 mg Oral Q6H PRN Hessie Knows, MD      . zolpidem Lorrin Mais) tablet 5 mg  5 mg Oral QHS PRN Hessie Knows, MD         Discharge Medications: Please see discharge summary for a list of discharge medications.  Relevant Imaging Results:  Relevant Lab Results:   Additional Information  (SSN: [id number])  Fauna Neuner, Veronia Beets, LCSW

## 2017-07-21 ENCOUNTER — Encounter: Payer: Self-pay | Admitting: Orthopedic Surgery

## 2017-07-21 LAB — CBC
HEMATOCRIT: 37.1 % (ref 35.0–47.0)
HEMOGLOBIN: 12.7 g/dL (ref 12.0–16.0)
MCH: 30.8 pg (ref 26.0–34.0)
MCHC: 34.3 g/dL (ref 32.0–36.0)
MCV: 89.6 fL (ref 80.0–100.0)
Platelets: 285 10*3/uL (ref 150–440)
RBC: 4.14 MIL/uL (ref 3.80–5.20)
RDW: 13.4 % (ref 11.5–14.5)
WBC: 15.8 10*3/uL — ABNORMAL HIGH (ref 3.6–11.0)

## 2017-07-21 LAB — BASIC METABOLIC PANEL
Anion gap: 7 (ref 5–15)
BUN: 11 mg/dL (ref 6–20)
CHLORIDE: 101 mmol/L (ref 101–111)
CO2: 26 mmol/L (ref 22–32)
Calcium: 9.2 mg/dL (ref 8.9–10.3)
Creatinine, Ser: 0.84 mg/dL (ref 0.44–1.00)
GFR calc Af Amer: >60 mL/min (ref >60)
GFR calc non Af Amer: >60 mL/min (ref >60)
GLUCOSE: 118 mg/dL — AB (ref 65–99)
POTASSIUM: 4.1 mmol/L (ref 3.5–5.1)
Sodium: 134 mmol/L — ABNORMAL LOW (ref 135–145)

## 2017-07-21 NOTE — Progress Notes (Signed)
Clinical Social Worker (CSW) received SNF consult. PT is recommending home health. RN case manager is aware of above. Please reconsult if future social work needs arise. CSW signing off.   Ronelle Smallman, LCSW [phone] 

## 2017-07-21 NOTE — Progress Notes (Signed)
Physical Therapy Treatment Patient Details Name: Caroline Vazquez MRN: [id number] DOB: 05-Jan-1951 Today's Date: 07/21/2017    History of Present Illness Pt is a 66 y.o. F s/p L TKA 07/20/2017. PMH: HTN, anxiety, CKD, hx kidney cancer, cervical fusion.    PT Comments    Pt able to ambulate 60 feet with CGA and RW during this session. Pt reports 4/10 L knee pain beginning of session; 5/10 L knee pain during exercises and ambulation; 7/10 L knee pain end of session. Pt requires vc's to achieve proper L LE heel strike; pt demonstrates good recall of cueing from morning session. Next session focus on increasing ROM, increasing ambulation distance and initiating stair training.   Follow Up Recommendations  Home health PT     Equipment Recommendations  Rolling walker with 5" wheels    Recommendations for Other Services       Precautions / Restrictions Precautions Precautions: Fall;Knee Precaution Booklet Issued: Yes (comment) Restrictions Weight Bearing Restrictions: Yes LLE Weight Bearing: Weight bearing as tolerated    Mobility  Bed Mobility Overal bed mobility: Modified Independent Bed Mobility: Sit to Supine     Sit to supine: Modified independent (Device/Increase time);HOB elevated   General bed mobility comments: pt able to perform sit to supine with HOB elevated and increased time/effort noted  Transfers Overall transfer level: Needs assistance Equipment used: Rolling walker (2 wheeled) Transfers: Sit to/from Stand Sit to Stand: Min guard         General transfer comment: pt able to perform sit to stand with CGA and RW for safety; good recall and demonstration of cues from morning session for proper B LE and B UE placement for proper form during sit to stand  Ambulation/Gait Ambulation/Gait assistance: Min guard Ambulation Distance (Feet): 60 Feet Assistive device: Rolling walker (2 wheeled)   Gait velocity: decreased   General Gait Details: pt able to ambulate  60 ft from bedside chair to outside of room to the corner near social work and back to EOB with CGA and RW for safety; vc's to achieve proper L LE heel strike which improved L LE step length (still decreased L LE step length); notable decreased L LE stance time still evident   Stairs            Wheelchair Mobility    Modified Rankin (Stroke Patients Only)       Balance Overall balance assessment: Needs assistance Sitting-balance support: No upper extremity supported;Feet supported Sitting balance-Leahy Scale: Good Sitting balance - Comments: pt able to perform static sitting balance with no UE support and B LE support with no overt loss of balance noted   Standing balance support: Bilateral upper extremity supported Standing balance-Leahy Scale: Good Standing balance comment: pt able to maintain static standing balance and ambulation with CGA and RW with no overt loss of balance noted                             Cognition Arousal/Alertness: Awake/alert Behavior During Therapy: WFL for tasks assessed/performed Overall Cognitive Status: Within Functional Limits for tasks assessed                                        Exercises Total Joint Exercises Ankle Circles/Pumps: AROM;Strengthening;Both;10 reps;Seated (in chair) Quad Sets: AROM;Strengthening;Both;10 reps;Seated (in chair) Straight Leg Raises: AROM;Strengthening;Left;10 reps;Seated (in chair with feet  raised) Long Arc Quad: AROM;Strengthening;Left;10 reps;Seated (in chair) Knee Flexion: AROM;Strengthening;Left;10 reps;Seated (in chair with pillow case under foot to assist slide across floor)    General Comments        Pertinent Vitals/Pain Pain Assessment: 0-10 Pain Score: 7  Pain Location: pt reports 4/10 L knee pain beginning of session with minimal increase during session; pt reports 7/10 L knee pain end of session Pain Descriptors / Indicators: Aching;Discomfort;Grimacing;Operative  site guarding;Sore Pain Intervention(s): Limited activity within patient's tolerance;Monitored during session;Premedicated before session;Repositioned;Ice applied  HR - 90 to 108 bpm throughout session via pulse ox O2 - 96 to 99% on RA throughout session via pulse ox    Home Living Family/patient expects to be discharged to:: Private residence Living Arrangements: Spouse/significant other;Children;Other relatives Available Help at Discharge: Family;Available 24 hours/day Type of Home: House Home Access: Stairs to enter Entrance Stairs-Rails: None Home Layout: One level Home Equipment: Cane - single point;Bedside commode;Shower seat;Toilet riser;Grab bars - toilet;Grab bars - tub/shower;Hand held shower head      Prior Function Level of Independence: Independent      Comments: pt reports independent with ADLs and functional mobility; reports 1 fall while attempting to keep husband (who uses RW) from falling    PT Goals (current goals can now be found in the care plan section) Acute Rehab PT Goals Patient Stated Goal: decrease pain and return home PT Goal Formulation: With patient Time For Goal Achievement: 08/04/17 Potential to Achieve Goals: Good Progress towards PT goals: Progressing toward goals    Frequency    BID      PT Plan Current plan remains appropriate    Co-evaluation              AM-PAC PT "6 Clicks" Daily Activity  Outcome Measure  Difficulty turning over in bed (including adjusting bedclothes, sheets and blankets)?: None Difficulty moving from lying on back to sitting on the side of the bed? : A Little Difficulty sitting down on and standing up from a chair with arms (e.g., wheelchair, bedside commode, etc,.)?: Total Help needed moving to and from a bed to chair (including a wheelchair)?: A Little Help needed walking in hospital room?: A Little Help needed climbing 3-5 steps with a railing? : A Lot 6 Click Score: 16    End of Session Equipment  Utilized During Treatment: Gait belt Activity Tolerance: Patient limited by fatigue Patient left: in bed;with call bell/phone within reach;with bed alarm set;with SCD's reapplied (L heel elevated via bone foam; R heel elevated via towel roll; SCD's in place and activated; polar care in place and activated) Nurse Communication: Mobility status (via whiteboard) PT Visit Diagnosis: Other abnormalities of gait and mobility (R26.89);Muscle weakness (generalized) (M62.81);History of falling (Z91.81);Pain Pain - Right/Left: Left Pain - part of body: Knee     Time: 1829-9371 PT Time Calculation (min) (ACUTE ONLY): 25 min  Charges:  $Therapeutic Exercise: 8-22 mins $Therapeutic Activity: 8-22 mins                    G CodesLaqueta Carina, SPT 2017-08-12,4:10 PM [phone]

## 2017-07-21 NOTE — Anesthesia Postprocedure Evaluation (Signed)
Anesthesia Post Note  Patient: Caroline Vazquez  Procedure(s) Performed: Procedure(s) (LRB): TOTAL KNEE ARTHROPLASTY (Left)  Patient location during evaluation: Nursing Unit Anesthesia Type: Spinal Level of consciousness: oriented and awake and alert Pain management: pain level controlled Vital Signs Assessment: post-procedure vital signs reviewed and stable Respiratory status: spontaneous breathing and respiratory function stable Cardiovascular status: blood pressure returned to baseline and stable Postop Assessment: no headache and no backache Anesthetic complications: no     Last Vitals:  Vitals:   07/21/17 0049 07/21/17 0436  BP: (!) 158/80 (!) 160/88  Pulse: 91 90  Resp: 20 18  Temp: 36.6 C 36.7 C    Last Pain:  Vitals:   07/21/17 0526  TempSrc:   PainSc: Haze Rushing A

## 2017-07-21 NOTE — Care Management Note (Signed)
Case Management Note  Patient Details  Name: Caroline Vazquez MRN: [id number] Date of Birth: [birth year]  Subjective/Objective:  POD # 1 left TKA. Met with patient at bedside. She is in a lot of pain. Nursing has been notified by PT. She lives at home with her spouse who will be her caregiver. Offered choice of home health agency. Referral to [facility]. She will need a walker. Ordered from [facility]. She normally uses a Corporate investment banker. Pharmacy- [facility] [phone]. Called Lovenox 40 mg # 14 no refills. PCP is hohn Min.                    Action/Plan: [facility] for HHPT, Lovenox called in, Ordered walker from [facility].   Expected Discharge Date:  07/23/17               Expected Discharge Plan:  [place]  In-House Referral:     Discharge planning Services  CM Consult  Post Acute Care Choice:  Durable Medical Equipment, Home Health Choice offered to:  Patient  DME Arranged:  Walker rolling DME Agency:  [place]:  PT [facility] Agency:  [facility] ([facility]  Status of Service:  In process, will continue to follow  If discussed at Long Length of Stay Meetings, dates discussed:    Additional Comments:  Jolly Mango, RN 07/21/2017, 3:07 PM

## 2017-07-21 NOTE — Evaluation (Signed)
Physical Therapy Evaluation Patient Details Name: Caroline Vazquez MRN: [id number] DOB: 04-Oct-1951 Today's Date: 07/21/2017   History of Present Illness  Pt is a 66 y.o. F s/p L TKA 07/20/2017. PMH: HTN, anxiety, CKD, hx kidney cancer, cervical fusion.  Clinical Impression  Prior to admission pt reports independent with ADLs and functional mobility. Pt reports family available to assist upon discharge. Pt currently modified independent with bed mobility; pt requires CGA and RW for transfers; pt requires CGA and RW for short distance ambulation. Cognition WFL for tasks assessed during session. Pt reports 5/10 L knee pain beginning of session; 8/10 L knee pain during activity; 6/10 L knee pain reported end of session. Pt will continue to benefit from skilled PT to address strength, pain, and functional mobility deficits. Recommended discharge to home with HHPT.    Follow Up Recommendations Home health PT    Equipment Recommendations  Rolling walker with 5" wheels    Recommendations for Other Services       Precautions / Restrictions Precautions Precautions: Fall;Knee Precaution Booklet Issued: Yes Restrictions Weight Bearing Restrictions: Yes LLE Weight Bearing: Weight bearing as tolerated      Mobility  Bed Mobility Overal bed mobility: Modified Independent Bed Mobility: Supine to Sit;Sit to Supine     Supine to sit: Modified independent (Device/Increase time);HOB elevated Sit to supine: Modified independent (Device/Increase time);HOB elevated   General bed mobility comments: pt able to perform sit to/from supine with HOB elevated with increased time/effort noted  Transfers Overall transfer level: Needs assistance Equipment used: Rolling walker (2 wheeled) Transfers: Sit to/from Stand Sit to Stand: Min guard         General transfer comment: pt able to perform sit to stand with CGA and RW for safety; vc's for proper B LE and B UE placement to achieve proper form during sit  to stand  Ambulation/Gait Ambulation/Gait assistance: Min guard   Assistive device: Rolling walker (2 wheeled)   Gait velocity: decreased   General Gait Details: pt able to ambulate 15 feet from EOB to toilet for toileting purposes with CGA and RW for safety; pt able to ambulate 25 feet from toilet to bedside chair with CGA and RW for safety; notable decreased L heel strike, step length, and stance time when ambulating; vc's to achieve proper heel strike during ambulation  Stairs            Wheelchair Mobility    Modified Rankin (Stroke Patients Only)       Balance Overall balance assessment: Needs assistance Sitting-balance support: Bilateral upper extremity supported;Feet unsupported Sitting balance-Leahy Scale: Good Sitting balance - Comments: pt able to perform static sitting balance with B UE and B LE supported; pt able to briefly raise both UE without overt loss of balance noted   Standing balance support: Bilateral upper extremity supported Standing balance-Leahy Scale: Good Standing balance comment: pt able to maintain static standing balance and ambulation with CGA and RW with no overt loss of balance noted; in static standing pt able to briefly remove B UE from RW to adjust gown with no unsteadiness or loss of balance noted                             Pertinent Vitals/Pain Pain Assessment: 0-10 Pain Score: 6  Pain Location: pt reports 5/10 L knee pain beginning of session; 8/10 L knee pain during activity; 6/10 L knee pain end of session Pain Descriptors /  Indicators: Aching;Discomfort;Grimacing;Operative site guarding;Sore Pain Intervention(s): Limited activity within patient's tolerance;Monitored during session;Premedicated before session;Repositioned;Ice applied  HR - 89 to 99 bpm throughout session via pulse ox O2 - 97 to 99% on RA throughout session via pulse ox    Home Living Family/patient expects to be discharged to:: Private  residence Living Arrangements: Spouse/significant other;Children;Other relatives Available Help at Discharge: Family;Available 24 hours/day Type of Home: House Home Access: Stairs to enter Entrance Stairs-Rails: None Entrance Stairs-Number of Steps: 1/2 step Home Layout: One level Home Equipment: Cane - single point;Bedside commode;Shower seat;Toilet riser;Grab bars - toilet;Grab bars - tub/shower;Hand held shower head      Prior Function Level of Independence: Independent         Comments: pt reports independent with ADLs and functional mobility; reports 1 fall while attempting to keep husband (who uses RW) from falling      Hand Dominance   Dominant Hand: Right    Extremity/Trunk Assessment   Upper Extremity Assessment Upper Extremity Assessment: Overall WFL for tasks assessed    Lower Extremity Assessment Lower Extremity Assessment: LLE deficits/detail LLE Deficits / Details: L LE at least 3/5 grossly for ankle DF, knee flexion/extension, hip flexion/abduction/adduction    Cervical / Trunk Assessment Cervical / Trunk Assessment: Normal  Communication   Communication: No difficulties  Cognition Arousal/Alertness: Awake/alert Behavior During Therapy: WFL for tasks assessed/performed Overall Cognitive Status: Within Functional Limits for tasks assessed                                        General Comments      Exercises Total Joint Exercises Ankle Circles/Pumps: AROM;Strengthening;Both;10 reps;Supine (HOB elevated) Quad Sets: AROM;Strengthening;Both;10 reps;Supine (HOB elevated) Short Arc Quad: AROM;Strengthening;Left;10 reps;Supine (HOB elevated) Heel Slides: AAROM;Strengthening;Left;10 reps;Supine (HOB elevated) Hip ABduction/ADduction: AROM;Strengthening;Left;10 reps;Supine (HOB elevated) Straight Leg Raises: AROM;Strengthening;Left;10 reps;Supine (HOB elevated) Goniometric ROM: L knee extension 12 degrees short of neutral beginning of  session in supine; L knee flexion 62 degrees end of session sitting in chair   Assessment/Plan    PT Assessment Patient needs continued PT services  PT Problem List Decreased strength;Decreased range of motion;Decreased activity tolerance;Decreased balance;Decreased mobility;Decreased knowledge of use of DME;Decreased knowledge of precautions;Pain       PT Treatment Interventions DME instruction;Stair training;Functional mobility training;Therapeutic activities;Gait training;Therapeutic exercise;Balance training;Patient/family education    PT Goals (Current goals can be found in the Care Plan section)  Acute Rehab PT Goals Patient Stated Goal: decrease pain and return home PT Goal Formulation: With patient Time For Goal Achievement: 08/04/17 Potential to Achieve Goals: Good    Frequency BID   Barriers to discharge        Co-evaluation               AM-PAC PT "6 Clicks" Daily Activity  Outcome Measure Difficulty turning over in bed (including adjusting bedclothes, sheets and blankets)?: None Difficulty moving from lying on back to sitting on the side of the bed? : A Little Difficulty sitting down on and standing up from a chair with arms (e.g., wheelchair, bedside commode, etc,.)?: Total Help needed moving to and from a bed to chair (including a wheelchair)?: A Little Help needed walking in hospital room?: A Little Help needed climbing 3-5 steps with a railing? : A Lot 6 Click Score: 16    End of Session Equipment Utilized During Treatment: Gait belt Activity Tolerance: Patient tolerated treatment well Patient  left: in chair;with call bell/phone within reach;with chair alarm set;with SCD's reapplied (B heels elevated via towel rolls; polar in place and activated; SCD's in place and activated) Nurse Communication: Mobility status;Weight bearing status;Other (comment) (pt reports nausea) PT Visit Diagnosis: Other abnormalities of gait and mobility (R26.89);Muscle weakness  (generalized) (M62.81);History of falling (Z91.81);Pain Pain - Right/Left: Left Pain - part of body: Knee    Time: 4840-3979 PT Time Calculation (min) (ACUTE ONLY): 39 min   Charges:         PT G CodesLaqueta Carina, SPT 2017-08-15,2:05 PM [phone]

## 2017-07-21 NOTE — Progress Notes (Signed)
Patient is A&O x 4. Up with assist x1 and [employer]. Urinating in bathroom. No BMs this shift. Fair diet. Pain is variable. NSL in [place]. On RA. Refuses Bone Foam. Bed alarm on for safety. Up in chair for a short time this shift.

## 2017-07-21 NOTE — Progress Notes (Signed)
   Subjective: 1 Day Post-Op Procedure(s) (LRB): TOTAL KNEE ARTHROPLASTY (Left) Patient reports pain as 5 on 0-10 scale.   Patient is complains of severe pain last night. Pain has improved. BP low yesterday, improved today. Denies any CP, SOB, ABD pain. We will start PT today  Objective: Vital signs in last 24 hours: Temp:  [97.6 F (36.4 C)-98.3 F (36.8 C)] 98.1 F (36.7 C) (07/25 0436) Pulse Rate:  [74-106] 90 (07/25 0436) Resp:  [10-20] 18 (07/25 0436) BP: (93-162)/(48-91) 160/88 (07/25 0436) SpO2:  [93 %-100 %] 96 % (07/25 0436) Weight:  [99.8 kg (220 lb)] 99.8 kg (220 lb) (07/24 0943)  Intake/Output from previous day: 07/24 0701 - 07/25 0700 In: 3240 [P.O.:240; I.V.:2750; IV Piggyback:250] Out: 1779 [Urine:3065; Blood:100] Intake/Output this shift: No intake/output data recorded.   Recent Labs  07/20/17 1513 07/21/17 0337  HGB 11.9* 12.7    Recent Labs  07/20/17 1513 07/21/17 0337  WBC 11.0 15.8*  RBC 3.94 4.14  HCT 35.0 37.1  PLT 256 285    Recent Labs  07/20/17 1513 07/21/17 0337  NA  --  134*  K  --  4.1  CL  --  101  CO2  --  26  BUN  --  11  CREATININE 0.90 0.84  GLUCOSE  --  118*  CALCIUM  --  9.2   No results for input(s): LABPT, INR in the last 72 hours.  EXAM General - Patient is Alert, Appropriate and Oriented Extremity - Neurovascular intact Sensation intact distally Intact pulses distally Dorsiflexion/Plantar flexion intact No cellulitis present Compartment soft Dressing - dressing C/D/I and no drainage Motor Function - intact, moving foot and toes well on exam.   Past Medical History:  Diagnosis Date  . Anxiety   . Arthritis   . Cancer [facility])    Right Kidney  . Chronic kidney disease   . Goiter, nodular   . Hypertension     Assessment/Plan:   1 Day Post-Op Procedure(s) (LRB): TOTAL KNEE ARTHROPLASTY (Left) Active Problems:   Primary localized osteoarthritis of left knee  Estimated body mass index is 31.57 kg/m  as calculated from the following:   Height as of this encounter: 5\' 10"  (1.778 m).   Weight as of this encounter: 99.8 kg (220 lb). Advance diet Up with therapy  Needs BM Recheck labs in the am CM to assist with discharge   DVT Prophylaxis - Lovenox, Foot Pumps and TED hose Weight-Bearing as tolerated to left leg   T. Rachelle Hora, PA-C [place] 07/21/2017, 8:02 AM

## 2017-07-21 NOTE — Evaluation (Signed)
Occupational Therapy Evaluation Patient Details Name: Caroline Vazquez MRN: [id number] DOB: 1951/03/06 Today's Date: 07/21/2017    History of Present Illness 66 yo female pt s/p L TKA POD #1 for OT evaluation. PMHx includes HTN, renal disease, anxiety, CKD, hx kidney cancer, cervical fusion.    Clinical Impression   Pt seen for OT evaluation. Pt is 66 year old female s/p L TKR, POD#1 for OT evaluation. Pt is a retired high school Psychologist, prison and probation services, living with her spouse (who uses RW, limited somewhat by heart condition), daughter, and granddaughter currently who can provide support at home. Pt was independent in all ADLs and IADL including driving prior to surgery and is eager to return to PLOF.  Pt currently requires minimal assist for LB dressing while in seated position due to pain and limited AROM of * knee. Pt also limited this date by dizziness upon sitting which did not resolve after several minutes seated EOB. Pt face was flushed. Pt requested to lay back down. BP 146/78. Pt L knee pain increased to 7/10 with bed mobility, improving after lying back down.  Pt would benefit from additional instruction in dressing techniques with or without assistive devices for dressing and bathing skills in addition to home/routines modifications to maximize safety in the home and prevent falls. Will assess for OT [facility] needs as pt progresses in therapy, however, at this time don't anticipate any additional OT needs. Pt has good set up of home environment.     Follow Up Recommendations  No OT follow up    Equipment Recommendations  None recommended by OT    Recommendations for Other Services       Precautions / Restrictions Precautions Precautions: Fall;Knee Precaution Booklet Issued: No Restrictions Weight Bearing Restrictions: Yes LLE Weight Bearing: Weight bearing as tolerated      Mobility Bed Mobility Overal bed mobility: Needs Assistance Bed Mobility: Supine to Sit;Sit to Supine     Supine  to sit: Supervision;HOB elevated Sit to supine: Min guard   General bed mobility comments: moderate BUE reliance and use of bed rails for sup> sit, min guard for LLE for sit> sup due to increased pain/dizziness  Transfers                 General transfer comment: deferred due to pt's increased pain and dizziness    Balance Overall balance assessment: Needs assistance Sitting-balance support: Bilateral upper extremity supported;Feet unsupported Sitting balance-Leahy Scale: Good                                     ADL either performed or assessed with clinical judgement   ADL Overall ADL's : Needs assistance/impaired Eating/Feeding: Set up;Sitting   Grooming: Set up;Sitting   Upper Body Bathing: Set up;Sitting   Lower Body Bathing: Set up;Minimal assistance;Sit to/from stand;Bed level   Upper Body Dressing : Set up;Sitting   Lower Body Dressing: Minimal assistance;Set up;Sit to/from stand;Bed level Lower Body Dressing Details (indicate cue type and reason): educated in AE for LB dressing, pt verbalized understanding, declined to trial due to increasing L knee pain and dizziness   Toilet Transfer Details (indicate cue type and reason): deferred due to pain/dizziness           General ADL Comments: pt generally min assist for LB ADL, pain is limiting factor     Vision Baseline Vision/History: Wears glasses Wears Glasses: At all times Patient  Visual Report: No change from baseline Vision Assessment?: No apparent visual deficits     Perception     Praxis      Pertinent Vitals/Pain Pain Assessment: 0-10 Pain Score: 7  Pain Location: 5/10 at rest in L knee, increasing to 7/10 in L knee with bed mobility Pain Descriptors / Indicators: Aching;Discomfort;Grimacing Pain Intervention(s): Limited activity within patient's tolerance;Monitored during session;Premedicated before session;Repositioned;Ice applied     Hand Dominance Right    Extremity/Trunk Assessment Upper Extremity Assessment Upper Extremity Assessment: Overall WFL for tasks assessed   Lower Extremity Assessment Lower Extremity Assessment: Defer to PT evaluation;LLE deficits/detail LLE Deficits / Details: anticipated strength/ROM deficits   Cervical / Trunk Assessment Cervical / Trunk Assessment: Normal   Communication Communication Communication: No difficulties   Cognition Arousal/Alertness: Awake/alert Behavior During Therapy: WFL for tasks assessed/performed Overall Cognitive Status: Within Functional Limits for tasks assessed                                     General Comments       Exercises Other Exercises Other Exercises: pt educated in pursed lip breathing to support pain mgt, pt demo'd understanding   Shoulder Instructions      Home Living Family/patient expects to be discharged to:: Private residence Living Arrangements: Spouse/significant other;Children;Other relatives (daughter and granddaughter staying with pt for a while) Available Help at Discharge: Family;Available 24 hours/day Type of Home: House Home Access: Stairs to enter [employer] of Steps: 1/2 step Entrance Stairs-Rails: None Home Layout: One level     Bathroom Shower/Tub: Occupational psychologist: Handicapped height (with riser) Bathroom Accessibility: Yes How Accessible: Accessible via wheelchair;Accessible via walker Home Equipment: Fries - single point;Bedside commode;Shower seat;Toilet riser;Grab bars - toilet;Grab bars - tub/shower;Hand held shower head;Adaptive equipment Adaptive Equipment: Reacher;Long-handled shoe horn        Prior Functioning/Environment Level of Independence: Independent        Comments: Pt indep with ADL, IADL including driving, retired, enjoys bowling but has been unable to participate due to pain; endorses 1 fall - attempted to keep spouse who uses RW from falling and fell in the process         OT Problem List: Decreased strength;Pain;Decreased range of motion;Decreased knowledge of use of DME or AE      OT Treatment/Interventions: Self-care/ADL training;Therapeutic activities;Therapeutic exercise;Energy conservation;DME and/or AE instruction;Patient/family education    OT Goals(Current goals can be found in the care plan section) Acute Rehab OT Goals Patient Stated Goal: have less pain OT Goal Formulation: With patient Time For Goal Achievement: 08/04/17 Potential to Achieve Goals: Good  OT Frequency: Min 1X/week   Barriers to D/C:            Co-evaluation              AM-PAC PT "6 Clicks" Daily Activity     Outcome Measure Help from another person eating meals?: None Help from another person taking care of personal grooming?: None Help from another person toileting, which includes using toliet, bedpan, or urinal?: A Little Help from another person bathing (including washing, rinsing, drying)?: A Little Help from another person to put on and taking off regular upper body clothing?: None Help from another person to put on and taking off regular lower body clothing?: A Little 6 Click Score: 21   End of Session    Activity Tolerance: Patient limited by pain  Patient left: in bed;with call bell/phone within reach;with bed alarm set;with SCD's reapplied;Other (comment) (polar care in place)  OT Visit Diagnosis: Other abnormalities of gait and mobility (R26.89);Pain Pain - Right/Left: Left Pain - part of body: Knee                Time: 0924-1000 OT Time Calculation (min): 36 min Charges:  OT General Charges $OT Visit: 1 Procedure OT Evaluation $OT Eval Low Complexity: 1 Procedure OT Treatments $Self Care/Home Management : 8-22 mins G-Codes:     Jeni Salles, MPH, MS, OTR/L ascom [phone] 07/21/17, 11:07 AM

## 2017-07-22 LAB — CBC
HEMATOCRIT: 34.8 % — AB (ref 35.0–47.0)
HEMOGLOBIN: 11.9 g/dL — AB (ref 12.0–16.0)
MCH: 30 pg (ref 26.0–34.0)
MCHC: 34.2 g/dL (ref 32.0–36.0)
MCV: 87.7 fL (ref 80.0–100.0)
Platelets: 257 10*3/uL (ref 150–440)
RBC: 3.97 MIL/uL (ref 3.80–5.20)
RDW: 13.2 % (ref 11.5–14.5)
WBC: 12.9 10*3/uL — ABNORMAL HIGH (ref 3.6–11.0)

## 2017-07-22 LAB — BASIC METABOLIC PANEL
Anion gap: 8 (ref 5–15)
BUN: 12 mg/dL (ref 6–20)
CHLORIDE: 98 mmol/L — AB (ref 101–111)
CO2: 27 mmol/L (ref 22–32)
CREATININE: 0.78 mg/dL (ref 0.44–1.00)
Calcium: 8.9 mg/dL (ref 8.9–10.3)
GFR calc Af Amer: >60 mL/min (ref >60)
GFR calc non Af Amer: >60 mL/min (ref >60)
GLUCOSE: 132 mg/dL — AB (ref 65–99)
POTASSIUM: 4 mmol/L (ref 3.5–5.1)
Sodium: 133 mmol/L — ABNORMAL LOW (ref 135–145)

## 2017-07-22 MED ORDER — TRAMADOL HCL 50 MG PO TABS: 50.0000 mg | ORAL_TABLET | Freq: Four times a day (QID) | ORAL | 0 refills | Status: DC | PRN

## 2017-07-22 MED ORDER — OXYCODONE HCL 5 MG PO TABS: 5.0000 mg | ORAL_TABLET | ORAL | 0 refills | Status: DC | PRN

## 2017-07-22 MED ORDER — ENOXAPARIN SODIUM 40 MG/0.4ML ~~LOC~~ SOLN: 40.0000 mg | SUBCUTANEOUS | 0 refills | Status: DC

## 2017-07-22 NOTE — Progress Notes (Signed)
Physical Therapy Treatment Patient Details Name: Caroline Vazquez MRN: [id number] DOB: 1951-08-04 Today's Date: 07/22/2017    History of Present Illness Pt is a 65 y.o. F s/p L TKA 07/20/2017. PMH: HTN, anxiety, CKD, hx kidney cancer, cervical fusion.    PT Comments    Pt able to ambulate 150 ft with CGA and RW during this session. Pt reports 4/10 L knee pain beginning of session; 7/10 L knee pain during activity; 5/10 L knee pain end of session (nursing in room giving meds end of session). Pt demonstrates good recall of cueing for proper form during sit to stands and L LE heel strike during ambulation. Next session focus on increasing ambulation distance and initiating stair training.    Follow Up Recommendations  Home health PT     Equipment Recommendations  Rolling walker with 5" wheels    Recommendations for Other Services       Precautions / Restrictions Precautions Precautions: Fall;Knee Precaution Booklet Issued: Yes (comment) Restrictions Weight Bearing Restrictions: Yes LLE Weight Bearing: Weight bearing as tolerated    Mobility  Bed Mobility Overal bed mobility: Modified Independent Bed Mobility: Supine to Sit     Supine to sit: Modified independent (Device/Increase time)     General bed mobility comments: pt able to perform supine to sit with increased time/effort noted  Transfers Overall transfer level: Needs assistance Equipment used: Rolling walker (2 wheeled) Transfers: Sit to/from Stand Sit to Stand: Min guard         General transfer comment: pt able to perform sit to stand with CGA and RW for safety; good recall and demonstration of proper form for B LE and B UE placement  Ambulation/Gait Ambulation/Gait assistance: Min guard Ambulation Distance (Feet): 150 Feet Assistive device: Rolling walker (2 wheeled)   Gait velocity: decreased   General Gait Details: pt able to ambulate from EOB to the nursing station, down one hall next to the nursing  station and back to her bedside chair with CGA and RW for safety; pt demonstrates improvement with L LE heel strike (continues to demonstrate decreased L LE step length)   Stairs            Wheelchair Mobility    Modified Rankin (Stroke Patients Only)       Balance Overall balance assessment: Needs assistance Sitting-balance support: No upper extremity supported;Feet supported Sitting balance-Leahy Scale: Good Sitting balance - Comments: pt able to perform static sitting balance with no UE support and B LE support with no overt loss of balance noted   Standing balance support: Bilateral upper extremity supported Standing balance-Leahy Scale: Good Standing balance comment: pt able to maintain static standing balance and ambulation with CGA and RW with no overt loss of balance noted; pt able to briefly remove B UE from RW to adjust gown in static standing with no overt loss of balance noted                            Cognition Arousal/Alertness: Awake/alert Behavior During Therapy: WFL for tasks assessed/performed Overall Cognitive Status: Within Functional Limits for tasks assessed                                        Exercises Total Joint Exercises Quad Sets: AROM;Strengthening;Left;10 reps;Supine Straight Leg Raises: AROM;Strengthening;Left;10 reps;Supine Long Arc Quad: AROM;Strengthening;Left;10 reps;Seated (EOB) Knee Flexion:  AROM;Strengthening;Left;10 reps;Seated (EOB with pillow case under L foot to assist motion) Goniometric ROM: L knee extension 6 degrees short of neutral beginning of session in supine; L knee flexion 77 degrees end of session sitting in chair Other Exercises Other Exercises: seated marching B LE x10 on EOB    General Comments        Pertinent Vitals/Pain Pain Assessment: 0-10 Pain Score: 5  Pain Location: pt reports 4/10 L knee pain beginning of session; 7/10 L knee pain during activity; 5/10 L knee pain end of  session Pain Descriptors / Indicators: Aching;Discomfort;Grimacing;Operative site guarding;Sore Pain Intervention(s): Limited activity within patient's tolerance;Monitored during session;Premedicated before session;Repositioned;Ice applied  HR - 96 to 108 bpm throughout session via pulse ox O2 - 95 to 98% on RA throughout session via pulse ox    Home Living                      Prior Function            PT Goals (current goals can now be found in the care plan section) Acute Rehab PT Goals Patient Stated Goal: decrease pain and return home PT Goal Formulation: With patient Time For Goal Achievement: 08/04/17 Potential to Achieve Goals: Good Progress towards PT goals: Progressing toward goals    Frequency    BID      PT Plan Current plan remains appropriate    Co-evaluation              AM-PAC PT "6 Clicks" Daily Activity  Outcome Measure  Difficulty turning over in bed (including adjusting bedclothes, sheets and blankets)?: None Difficulty moving from lying on back to sitting on the side of the bed? : A Little Difficulty sitting down on and standing up from a chair with arms (e.g., wheelchair, bedside commode, etc,.)?: A Little Help needed moving to and from a bed to chair (including a wheelchair)?: A Little Help needed walking in hospital room?: A Little Help needed climbing 3-5 steps with a railing? : A Lot 6 Click Score: 18    End of Session Equipment Utilized During Treatment: Gait belt Activity Tolerance: Patient tolerated treatment well Patient left: in chair;with call bell/phone within reach;with chair alarm set;with nursing/sitter in room;with SCD's reapplied (B heels elevated via towel rolls; polar care in place and activated; SCD's in place and activated) Nurse Communication: Mobility status PT Visit Diagnosis: Other abnormalities of gait and mobility (R26.89);Muscle weakness (generalized) (M62.81);History of falling (Z91.81);Pain Pain -  Right/Left: Left Pain - part of body: Knee     Time: 4580-9983 PT Time Calculation (min) (ACUTE ONLY): 27 min  Charges:                       G CodesLaqueta Carina, SPT 2017/08/09,10:19 AM [phone]

## 2017-07-22 NOTE — Progress Notes (Signed)
Physical Therapy Treatment Patient Details Name: Caroline Vazquez MRN: [id number] DOB: 1951/07/13 Today's Date: 07/22/2017    History of Present Illness Pt is a 66 y.o. F s/p L TKA 07/20/2017. PMH: HTN, anxiety, CKD, hx kidney cancer, cervical fusion.    PT Comments    Pt able to ambulate the nursing loop with CGA and RW for safety; pt able to ascend/descend 1 stair x2 (as necessary to enter exit home). Pt reports 5/10 L knee pain beginning and end of session; 6/10 L knee pain reported with activity (pre-medicated). Pt continues to require initial vc's for L LE heel strike; implemented TKE exercise to assist with this. Next session focus on increasing ROM, focusing on TKE and increasing ambulation distance.   Follow Up Recommendations  Home health PT     Equipment Recommendations  Rolling walker with 5" wheels    Recommendations for Other Services       Precautions / Restrictions Precautions Precautions: Fall;Knee Precaution Booklet Issued: Yes (comment) Restrictions Weight Bearing Restrictions: Yes LLE Weight Bearing: Weight bearing as tolerated    Mobility  Bed Mobility Overal bed mobility: Modified Independent Bed Mobility: Supine to Sit;Sit to Supine     Supine to sit: Modified independent (Device/Increase time) Sit to supine: Modified independent (Device/Increase time)   General bed mobility comments: pt able to perform supine to sit and sit to supine with increased time/effort noted  Transfers Overall transfer level: Needs assistance Equipment used: Rolling walker (2 wheeled) Transfers: Sit to/from Stand Sit to Stand: Min guard         General transfer comment: pt able to perform sit to stand with CGA and RW for safety; good recall and demonstration of proper form for B LE and B UE placement  Ambulation/Gait Ambulation/Gait assistance: Min guard   Assistive device: Rolling walker (2 wheeled)   Gait velocity: decreased   General Gait Details: pt able to  ambulate ~100 ft from EOB to ortho gym with CGA and RW for safety; after stair training pt able to ambulate 200 ft from the stairs around the nursing loop and back to EOB; vc's required initially and as pt would "get tired" for proper form with heel strike   Stairs Stairs: Yes   Stair Management: With walker;Forwards Number of Stairs: 1 General stair comments: pt able to ambulate up/down one stair x2 forwards with CGA and RW   Wheelchair Mobility    Modified Rankin (Stroke Patients Only)       Balance Overall balance assessment: Needs assistance Sitting-balance support: No upper extremity supported;Feet supported Sitting balance-Leahy Scale: Good Sitting balance - Comments: pt able to perform static sitting balance with no UE support and B LE support with no overt loss of balance noted; pt able to reach outsidde of base of support to reach for water bottle with no overt loss of balance   Standing balance support: Bilateral upper extremity supported Standing balance-Leahy Scale: Good Standing balance comment: pt able to maintain static standing balance and ambulation with CGA and RW with no overt loss of balance noted; pt able to briefly remove L UE from RW to adjust gown in static standing with no overt loss of balance noted                            Cognition Arousal/Alertness: Awake/alert Behavior During Therapy: WFL for tasks assessed/performed Overall Cognitive Status: Within Functional Limits for tasks assessed  Exercises Total Joint Exercises Ankle Circles/Pumps: AROM;Strengthening;Both;10 reps;Supine Quad Sets: AROM;Strengthening;Both;10 reps;Supine Straight Leg Raises: AROM;Strengthening;Left;10 reps;Supine Long Arc Quad: AROM;Strengthening;Left;10 reps;Seated Knee Flexion: AROM;Strengthening;Left;10 reps;Seated (pillow case underneath foot to assist with slide) Other Exercises Other Exercises: seated  marching B LE x10 on EOB Other Exercises: pre-gait training activity with emphasis L on heel strike, toe-off, and terminal knee extension x10 with RW and CGA    General Comments        Pertinent Vitals/Pain Pain Assessment: 0-10 Pain Score: 5  Pain Location: pt reports 5/10 L knee pain beginning and end of session; 6/10 L knee pain reported with activity Pain Descriptors / Indicators: Aching;Discomfort;Grimacing;Operative site guarding;Sore Pain Intervention(s): Limited activity within patient's tolerance;Monitored during session;Premedicated before session;Repositioned;Ice applied HR - 98 to 116 bpm throughout session via pulse ox O2 - 95 to 98% on RA throughout session via pulse ox    Home Living                      Prior Function            PT Goals (current goals can now be found in the care plan section) Acute Rehab PT Goals Patient Stated Goal: decrease pain and return home PT Goal Formulation: With patient Time For Goal Achievement: 08/04/17 Potential to Achieve Goals: Good Progress towards PT goals: Progressing toward goals    Frequency    BID      PT Plan Current plan remains appropriate    Co-evaluation              AM-PAC PT "6 Clicks" Daily Activity  Outcome Measure  Difficulty turning over in bed (including adjusting bedclothes, sheets and blankets)?: None Difficulty moving from lying on back to sitting on the side of the bed? : A Little Difficulty sitting down on and standing up from a chair with arms (e.g., wheelchair, bedside commode, etc,.)?: A Little Help needed moving to and from a bed to chair (including a wheelchair)?: A Little Help needed walking in hospital room?: A Little Help needed climbing 3-5 steps with a railing? : A Lot 6 Click Score: 18    End of Session Equipment Utilized During Treatment: Gait belt Activity Tolerance: Patient tolerated treatment well Patient left: in bed;with call bell/phone within reach;with bed  alarm set;with SCD's reapplied (L heel elevated via bone foam; R heel elevated via towel roll; SCD's in place and activated; polar care in place and activated) Nurse Communication: Mobility status PT Visit Diagnosis: Other abnormalities of gait and mobility (R26.89);Muscle weakness (generalized) (M62.81);History of falling (Z91.81);Pain Pain - Right/Left: Left Pain - part of body: Knee     Time: 9450-3888 PT Time Calculation (min) (ACUTE ONLY): 38 min  Charges:                       G CodesLaqueta Carina, SPT Aug 06, 2017,4:21 PM [phone]

## 2017-07-22 NOTE — Discharge Summary (Signed)
Physician Discharge Summary  Patient ID: Caroline Vazquez MRN: [id number] DOB/AGE: 29-Nov-1951 66 y.o.  Admit date: 07/20/2017 Discharge date:07/23/2017 Admission Diagnoses:  PRIMARY OSTEOARTHRITIS LEFT KNEE   Discharge Diagnoses: Patient Active Problem List   Diagnosis Date Noted  . Primary localized osteoarthritis of left knee 07/20/2017    Past Medical History:  Diagnosis Date  . Anxiety   . Arthritis   . Cancer [facility])    Right Kidney  . Chronic kidney disease   . Goiter, nodular   . Hypertension      Transfusion: none   Consultants (if any):   Discharged Condition: Improved  Hospital Course: Caroline Vazquez is an 66 y.o. female who was admitted 07/20/2017 with a diagnosis of  Left knee osteoarthritis and went to the operating room on 07/20/2017 and underwent the above named procedures.    Surgeries: Procedure(s): TOTAL KNEE ARTHROPLASTY on 07/20/2017 Patient tolerated the surgery well. Taken to PACU where she was stabilized and then transferred to the orthopedic floor.  Started on Lovenox 30 q 12 hrs. Foot pumps applied bilaterally at 80 mm. Heels elevated on bed with rolled towels. No evidence of DVT. Negative Homan. Physical therapy started on day #1 for gait training and transfer. OT started day #1 for ADL and assisted devices.  Patient's foley was d/c on day #1. Patient's IV was d/c on day #2.  On post op day #3 patient was stable and ready for discharge to home with HHPT.  Implants:  Medacta GMK sphere left 4+ femur, 4 tibia was short stem and 12 mm insert and 2 patella all components cemented  She was given perioperative antibiotics:  Anti-infectives    Start     Dose/Rate Route Frequency Ordered Stop   07/20/17 1700  clindamycin (CLEOCIN) IVPB 900 mg     900 mg 100 mL/hr over 30 Minutes Intravenous Every 6 hours 07/20/17 1435 07/21/17 1515   07/20/17 0915  clindamycin (CLEOCIN) 900 MG/50ML IVPB    Comments:  Marijean Niemann   : cabinet override      07/20/17  0915 07/20/17 1046   07/19/17 2245  clindamycin (CLEOCIN) IVPB 900 mg     900 mg 100 mL/hr over 30 Minutes Intravenous  Once 07/19/17 2235 07/20/17 1116    .  She was given sequential compression devices, early ambulation, and Lovenox for DVT prophylaxis.  She benefited maximally from the hospital stay and there were no complications.    Recent vital signs:  Vitals:   07/22/17 1542 07/22/17 2302  BP: 104/63 (!) 114/56  Pulse: 79 89  Resp: 16 17  Temp: 98.9 F (37.2 C) 97.7 F (36.5 C)    Recent laboratory studies:  Lab Results  Component Value Date   HGB 11.9 (L) 07/22/2017   HGB 12.7 07/21/2017   HGB 11.9 (L) 07/20/2017   Lab Results  Component Value Date   WBC 12.9 (H) 07/22/2017   PLT 257 07/22/2017   Lab Results  Component Value Date   INR 0.96 07/07/2017   Lab Results  Component Value Date   NA 133 (L) 07/22/2017   K 4.0 07/22/2017   CL 98 (L) 07/22/2017   CO2 27 07/22/2017   BUN 12 07/22/2017   CREATININE 0.78 07/22/2017   GLUCOSE 132 (H) 07/22/2017    Discharge Medications:   Allergies as of 07/22/2017      Reactions   Penicillins Anaphylaxis   Has patient had a PCN reaction causing immediate rash, facial/tongue/throat swelling, SOB or lightheadedness  with hypotension: Yes  Has patient had a PCN reaction causing severe rash involving mucus membranes or skin necrosis:Unknown Has patient had a PCN reaction that required hospitalization:Yes Has patient had a PCN reaction occurring within the last 10 years:No If all of the above answers are "NO", then may proceed with Cephalosporin use.   Nsaids Other (See Comments)   Pt only has 1 kidney       Medication List    TAKE these medications   acetaminophen 500 MG tablet Commonly known as:  TYLENOL Take 1,000 mg by mouth every 6 (six) hours as needed (for pain.).   amLODipine 5 MG tablet Commonly known as:  NORVASC Take 5 mg by mouth 2 (two) times daily.   busPIRone 10 MG tablet Commonly known as:   BUSPAR Take 10 mg by mouth 2 (two) times daily.   enoxaparin 40 MG/0.4ML injection Commonly known as:  LOVENOX Inject 0.4 mLs (40 mg total) into the skin daily.   lisinopril 10 MG tablet Commonly known as:  PRINIVIL,ZESTRIL Take 10 mg by mouth daily.   metoprolol succinate 100 MG 24 hr tablet Commonly known as:  TOPROL-XL Take 50 mg by mouth 2 (two) times daily.   oxyCODONE 5 MG immediate release tablet Commonly known as:  Oxy IR/ROXICODONE Take 1-2 tablets (5-10 mg total) by mouth every 4 (four) hours as needed for severe pain or breakthrough pain.   oxyCODONE-acetaminophen 5-325 MG tablet Commonly known as:  PERCOCET/ROXICET Take 1 tablet by mouth every 6 (six) hours as needed. For pain.   traMADol 50 MG tablet Commonly known as:  ULTRAM Take 1-2 tablets (50-100 mg total) by mouth every 6 (six) hours as needed for moderate pain.            Durable Medical Equipment        Start     Ordered   07/20/17 1436  DME Walker rolling  Once    Question:  Patient needs a walker to treat with the following condition  Answer:  Status post total left knee replacement   07/20/17 1435   07/20/17 1436  DME 3 n 1  Once     07/20/17 1435   07/20/17 1436  DME Bedside commode  Once    Question:  Patient needs a bedside commode to treat with the following condition  Answer:  Status post total left knee replacement   07/20/17 1435      Diagnostic Studies: Dg Knee 1-2 Views Left  Result Date: 07/20/2017 CLINICAL DATA:  Knee replacement postop pain EXAM: LEFT KNEE - 1-2 VIEW COMPARISON:  None. FINDINGS: Total knee replacement in satisfactory position. No fracture or immediate complication. Small joint effusion IMPRESSION: Satisfactory knee replacement Electronically Signed   By: Franchot Gallo M.D.   On: 07/20/2017 12:49   Ct Knee Left Wo Contrast  Result Date: 06/24/2017 CLINICAL DATA:  Osteoarthritis, preoperative assessment EXAM: CT OF THE left KNEE WITHOUT CONTRAST TECHNIQUE:  Multidetector CT imaging of the Left knee was performed according to the standard protocol. Multiplanar CT image reconstructions were also generated. COMPARISON:  None. FINDINGS: Left hip: Spurring of the left acetabulum and spurring along the greater trochanter. Loss of articular space. Left ankle: Generated findings along the Lisfranc joint. The hindfoot was mostly excluded from the field of view. Left knee: Severe osteoarthrosis with prominent loss of articular space in the medial compartment and in the patellofemoral joint, and mild loss of articular space in the lateral compartment. Prominent tricompartmental spurring and spurring  along the intercondylar notch and tibial spine. Mild subcortical eburnation in the medial compartment. There is an 8 mm ossicle along the posterior margin of the patellar tendon. Upper normal amount of fluid in the knee joint. Contours of the patellar and quadriceps tendons normal. IMPRESSION: 1. Severe osteoarthritis of the left knee as described above. 2. Articular space narrowing and spurring of the left hip. 3. Degenerative findings at the Lisfranc joint. Electronically Signed   By: Van Clines M.D.   On: 06/24/2017 16:50    Disposition: Final discharge disposition not confirmed    Follow-up Information    Hessie Knows, MD Follow up in 2 week(s).   Specialty:  Orthopedic Surgery Contact information: [place] [id number] [phone]            Signed: Feliberto Gottron 07/22/2017, 11:36 PM

## 2017-07-22 NOTE — Discharge Instructions (Signed)

## 2017-07-22 NOTE — Progress Notes (Signed)
   Subjective: 2 Days Post-Op Procedure(s) (LRB): TOTAL KNEE ARTHROPLASTY (Left) Patient reports pain as moderate.   Patient is doing well. NO complaints. Denies any CP, SOB, ABD pain. We will continue PT today  Objective: Vital signs in last 24 hours: Temp:  [98.1 F (36.7 C)-99.6 F (37.6 C)] 98.6 F (37 C) (07/26 0725) Pulse Rate:  [79-90] 79 (07/26 0725) Resp:  [16-18] 16 (07/26 0725) BP: (126-149)/(63-75) 126/63 (07/26 0725) SpO2:  [96 %-99 %] 96 % (07/26 0725)  Intake/Output from previous day: 07/25 0701 - 07/26 0700 In: 600 [P.O.:600] Out: -  Intake/Output this shift: No intake/output data recorded.   Recent Labs  07/20/17 1513 07/21/17 0337 07/22/17 0331  HGB 11.9* 12.7 11.9*    Recent Labs  07/21/17 0337 07/22/17 0331  WBC 15.8* 12.9*  RBC 4.14 3.97  HCT 37.1 34.8*  PLT 285 257    Recent Labs  07/21/17 0337 07/22/17 0331  NA 134* 133*  K 4.1 4.0  CL 101 98*  CO2 26 27  BUN 11 12  CREATININE 0.84 0.78  GLUCOSE 118* 132*  CALCIUM 9.2 8.9   No results for input(s): LABPT, INR in the last 72 hours.  EXAM General - Patient is Alert, Appropriate and Oriented Extremity - Neurovascular intact Sensation intact distally Intact pulses distally Dorsiflexion/Plantar flexion intact No cellulitis present Compartment soft Dressing - dressing C/D/I and no drainage Motor Function - intact, moving foot and toes well on exam.   Past Medical History:  Diagnosis Date  . Anxiety   . Arthritis   . Cancer [facility])    Right Kidney  . Chronic kidney disease   . Goiter, nodular   . Hypertension     Assessment/Plan:   2 Days Post-Op Procedure(s) (LRB): TOTAL KNEE ARTHROPLASTY (Left) Active Problems:   Primary localized osteoarthritis of left knee  Estimated body mass index is 31.57 kg/m as calculated from the following:   Height as of this encounter: 5\' 10"  (1.778 m).   Weight as of this encounter: 99.8 kg (220 lb). Advance diet Up with therapy   Needs BM Labs and Vital signs are stable Pain controlled CM to assist with discharge to home with HHPT today or tomorrow pending progress with PT and BM   DVT Prophylaxis - Lovenox, Foot Pumps and TED hose Weight-Bearing as tolerated to left leg   T. Rachelle Hora, PA-C [place] 07/22/2017, 8:10 AM

## 2017-07-23 NOTE — Care Management Important Message (Signed)
Important Message  Patient Details  Name: ALIAYAH TYER MRN: [id number] Date of Birth: 10-27-1951   Medicare Important Message Given:  Yes    Jolly Mango, RN 07/23/2017, 8:29 AM

## 2017-07-23 NOTE — Care Management Note (Signed)
Case Management Note  Patient Details  Name: Caroline Vazquez MRN: [id number] Date of Birth: [birth year]  Subjective/Objective:  Discharging today                 Action/Plan: [facility] notified of discharge. Cost of Lovenox is $ 64.00. Patient aware and denies issues paying for medication.  Walker delivered.   Expected Discharge Date:  07/23/17               Expected Discharge Plan:  [place]  In-House Referral:     Discharge planning Services  CM Consult  Post Acute Care Choice:  Durable Medical Equipment, Home Health Choice offered to:  Patient  DME Arranged:  Walker rolling DME Agency:  [place]:  PT [place] Agency:  [facility]  Status of Service:  In process, will continue to follow  If discussed at Long Length of Stay Meetings, dates discussed:    Additional Comments:  Jolly Mango, RN 07/23/2017, 8:36 AM

## 2017-07-23 NOTE — Progress Notes (Signed)
Physical Therapy Treatment Patient Details Name: JOEY HUDOCK MRN: [id number] DOB: [birth year] Today's Date: 07/23/2017    History of Present Illness Pt is a 66 y.o. F s/p L TKA 07/20/2017. PMH: HTN, anxiety, CKD, hx kidney cancer, cervical fusion.    PT Comments    Pt able to ambulate nursing loop this session with CGA and RW for safety; L knee extension 3 degrees beginning of session; L knee flexion 92 degrees end of session. Pt reports 4/10 L knee pain beginning of session; 5/10 L knee pain reported with activity and end of session. Pt requires initial vc's for L LE heel strike with good recall throughout session. Next session focus on increasing ambulation distance with emphasis on TKE.    Follow Up Recommendations  Home health PT     Equipment Recommendations  Rolling walker with 5" wheels    Recommendations for Other Services       Precautions / Restrictions Precautions Precautions: Fall;Knee Precaution Booklet Issued: Yes (comment) Restrictions Weight Bearing Restrictions: Yes LLE Weight Bearing: Weight bearing as tolerated    Mobility  Bed Mobility Overal bed mobility: Modified Independent Bed Mobility: Supine to Sit     Supine to sit: Modified independent (Device/Increase time)     General bed mobility comments: pt able to perform supine to sit with increased time/effort noted  Transfers Overall transfer level: Needs assistance Equipment used: Rolling walker (2 wheeled) Transfers: Sit to/from Stand Sit to Stand: Min guard         General transfer comment: pt able to perform sit to stand with CGA and RW for safety; good recall and demonstration of proper form for B LE and B UE placement  Ambulation/Gait Ambulation/Gait assistance: Min guard Ambulation Distance (Feet): 200 Feet Assistive device: Rolling walker (2 wheeled)   Gait velocity: decreased   General Gait Details: pt able to ambulate 200 feet from EOB around the nursing loop and back to EOB  with CGA and RW; vc's initially for L LE heel strike and TKE with good recall demonstrated throughout session   Stairs            Wheelchair Mobility    Modified Rankin (Stroke Patients Only)       Balance Overall balance assessment: Needs assistance Sitting-balance support: No upper extremity supported;Feet supported Sitting balance-Leahy Scale: Good Sitting balance - Comments: pt able to perform static sitting balance with no UE support and B LE support with no overt loss of balance noted   Standing balance support: Bilateral upper extremity supported Standing balance-Leahy Scale: Good Standing balance comment: pt able to maintain static standing balance and ambulation with CGA and RW with no overt loss of balance noted; pt able to briefly remove B UE from RW to adjust gown in static standing with no overt loss of balance noted                            Cognition Arousal/Alertness: Awake/alert Behavior During Therapy: WFL for tasks assessed/performed Overall Cognitive Status: Within Functional Limits for tasks assessed                                        Exercises Total Joint Exercises Quad Sets: AROM;Strengthening;Both;10 reps;Supine Straight Leg Raises: AROM;Strengthening;Left;10 reps;Supine Long Arc Quad: AROM;Strengthening;Left;10 reps;Seated (EOB) Knee Flexion: AROM;Strengthening;Left;10 reps;Seated (EOB with pillow case under L foot to  assist with slide) Goniometric ROM: L knee extension 3 degrees short of neutral beginning of session in supine; L knee flexion 92 degrees end of session sitting in chair Other Exercises Other Exercises: seated marching B LE x10 on EOB Other Exercises: pre-gait training activity with emphasis L on heel strike, toe-off, and terminal knee extension x10 with RW and CGA    General Comments        Pertinent Vitals/Pain Pain Assessment: 0-10 Pain Score: 5  Pain Location: pt reports 4/10 L knee pain  beginning of session; 5/10 L knee pain reported with activity and end of session Pain Descriptors / Indicators: Aching;Discomfort;Grimacing;Operative site guarding;Sore Pain Intervention(s): Limited activity within patient's tolerance;Monitored during session;Premedicated before session;Repositioned;Ice applied HR - 96 to 111 bpm throughout session via pulse ox O2 - 95 to 99% on RA throughout session via pulse ox    Home Living                      Prior Function            PT Goals (current goals can now be found in the care plan section) Acute Rehab PT Goals Patient Stated Goal: decrease pain and return home PT Goal Formulation: With patient Time For Goal Achievement: 08/04/17 Potential to Achieve Goals: Good Progress towards PT goals: Progressing toward goals    Frequency    BID      PT Plan Current plan remains appropriate    Co-evaluation              AM-PAC PT "6 Clicks" Daily Activity  Outcome Measure  Difficulty turning over in bed (including adjusting bedclothes, sheets and blankets)?: None Difficulty moving from lying on back to sitting on the side of the bed? : A Little Difficulty sitting down on and standing up from a chair with arms (e.g., wheelchair, bedside commode, etc,.)?: A Little Help needed moving to and from a bed to chair (including a wheelchair)?: A Little Help needed walking in hospital room?: A Little Help needed climbing 3-5 steps with a railing? : A Little 6 Click Score: 19    End of Session Equipment Utilized During Treatment: Gait belt Activity Tolerance: Patient tolerated treatment well Patient left: in chair;with call bell/phone within reach;with chair alarm set;with SCD's reapplied (B heels elevated via towel rolls; SCD's in place and activated; polar care in place and activated) Nurse Communication: Mobility status PT Visit Diagnosis: Other abnormalities of gait and mobility (R26.89);Muscle weakness (generalized)  (M62.81);History of falling (Z91.81);Pain Pain - Right/Left: Left Pain - part of body: Knee     Time: 1552-0802 PT Time Calculation (min) (ACUTE ONLY): 25 min  Charges:                       G CodesLaqueta Carina, SPT 07-31-2017,11:31 AM [phone]

## 2017-07-23 NOTE — Progress Notes (Signed)
Patient is being discharged home with family. IV removed with cath intact. Discharge instructions given along with activity, diet, wound care, and cryo cuff. Reviewed scripts and last dose of meds given. Allowed time for questions. Both TEDs in place. Patient able to give own Lovenox injection without difficulty. LBM this shift.

## 2017-07-23 NOTE — Progress Notes (Signed)
   Subjective: 3 Days Post-Op Procedure(s) (LRB): TOTAL KNEE ARTHROPLASTY (Left) Patient reports pain as moderate.   Patient is doing well. NO complaints. Denies any CP, SOB, ABD pain. We will continue PT today  Objective: Vital signs in last 24 hours: Temp:  [97.7 F (36.5 C)-98.9 F (37.2 C)] 97.7 F (36.5 C) (07/26 2302) Pulse Rate:  [79-89] 89 (07/26 2302) Resp:  [16-17] 17 (07/26 2302) BP: (104-114)/(56-63) 114/56 (07/26 2302) SpO2:  [98 %] 98 % (07/26 2302)  Intake/Output from previous day: No intake/output data recorded. Intake/Output this shift: No intake/output data recorded.   Recent Labs  07/20/17 1513 07/21/17 0337 07/22/17 0331  HGB 11.9* 12.7 11.9*    Recent Labs  07/21/17 0337 07/22/17 0331  WBC 15.8* 12.9*  RBC 4.14 3.97  HCT 37.1 34.8*  PLT 285 257    Recent Labs  07/21/17 0337 07/22/17 0331  NA 134* 133*  K 4.1 4.0  CL 101 98*  CO2 26 27  BUN 11 12  CREATININE 0.84 0.78  GLUCOSE 118* 132*  CALCIUM 9.2 8.9   No results for input(s): LABPT, INR in the last 72 hours.  EXAM General - Patient is Alert, Appropriate and Oriented Extremity - Neurovascular intact Sensation intact distally Intact pulses distally Dorsiflexion/Plantar flexion intact No cellulitis present Compartment soft Dressing - dressing C/D/I and no drainage Motor Function - intact, moving foot and toes well on exam.   Past Medical History:  Diagnosis Date  . Anxiety   . Arthritis   . Cancer [facility])    Right Kidney  . Chronic kidney disease   . Goiter, nodular   . Hypertension     Assessment/Plan:   3 Days Post-Op Procedure(s) (LRB): TOTAL KNEE ARTHROPLASTY (Left) Active Problems:   Primary localized osteoarthritis of left knee  Estimated body mass index is 31.57 kg/m as calculated from the following:   Height as of this encounter: 5\' 10"  (1.778 m).   Weight as of this encounter: 99.8 kg (220 lb). Advance diet Up with therapy  Discharge home today  pending BM Follow up with [place] ortho in 2 weeks for staple removal   DVT Prophylaxis - Lovenox, Foot Pumps and TED hose Weight-Bearing as tolerated to left leg   T. Rachelle Hora, PA-C [place] 07/23/2017, 7:54 AM

## 2018-04-10 IMAGING — DX DG KNEE 1-2V*L*
2 series · 2 of 2 positions shown · non-contrast
Comparison: None.

CLINICAL DATA: Knee replacement postop pain

EXAM:
LEFT KNEE - 1-2 VIEW

[knee ap]
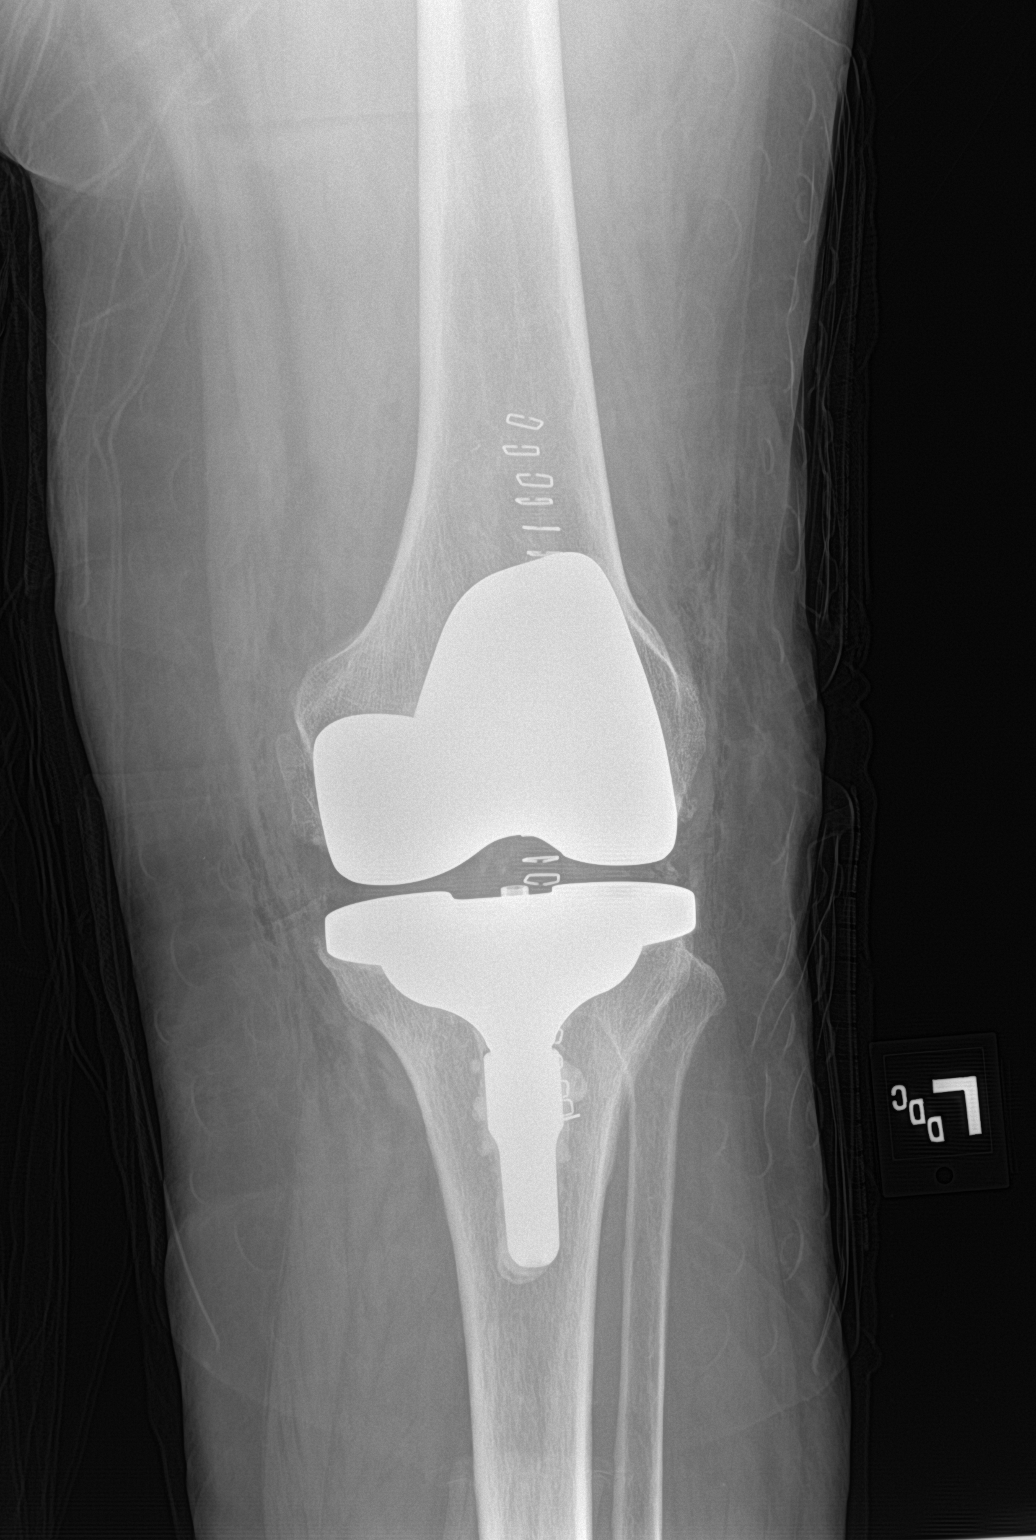

[knee lat]
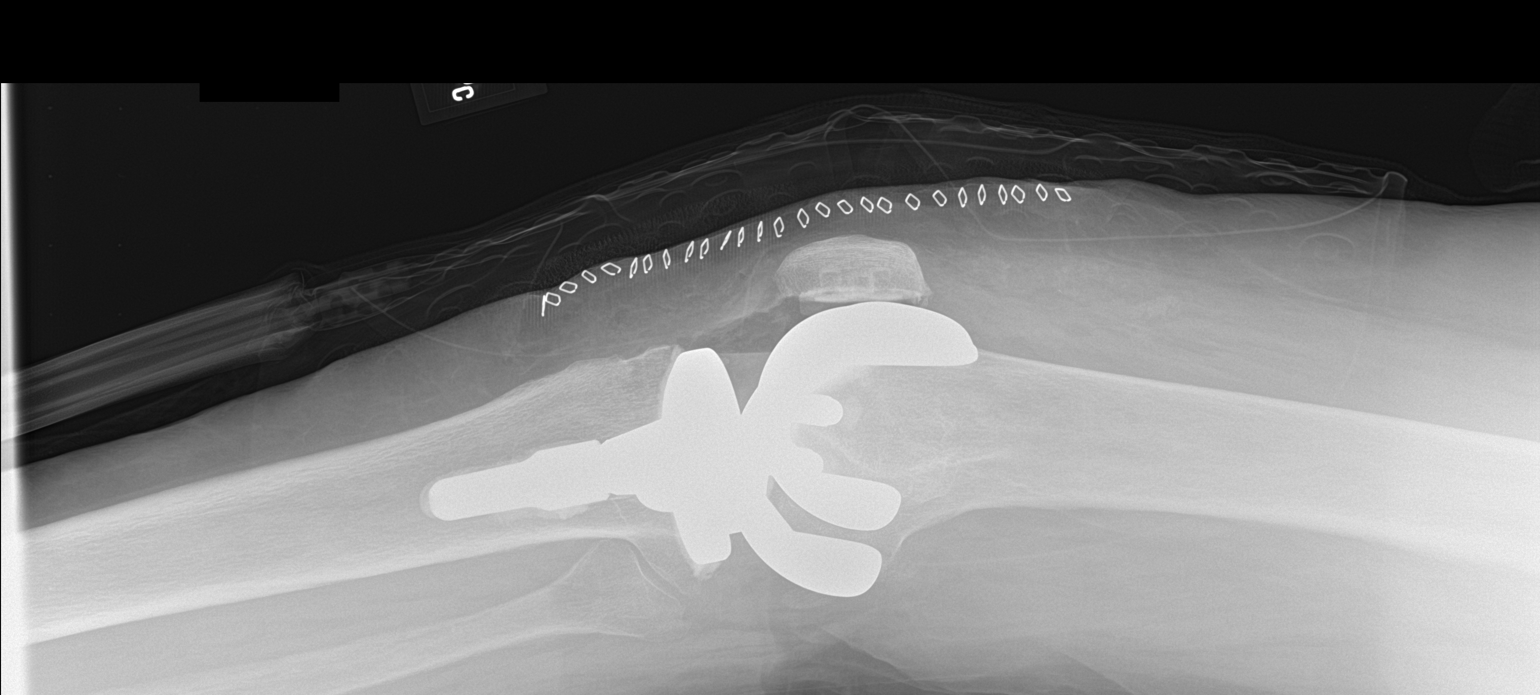

[2 of 2 positions shown; findings below may reference images not displayed]

FINDINGS: Total knee replacement in satisfactory position. No fracture or
immediate complication. Small joint effusion
IMPRESSION: Satisfactory knee replacement

## 2022-02-20 ENCOUNTER — Other Ambulatory Visit: Payer: Self-pay | Admitting: Surgery

## 2022-02-20 ENCOUNTER — Other Ambulatory Visit: Payer: Self-pay | Admitting: Family Medicine

## 2022-02-20 DIAGNOSIS — R270 Ataxia, unspecified: Secondary | ICD-10-CM

## 2022-02-20 DIAGNOSIS — M545 Low back pain, unspecified: Secondary | ICD-10-CM

## 2022-06-08 ENCOUNTER — Ambulatory Visit: Payer: Medicare Other | Admitting: Nurse Practitioner

## 2022-06-15 ENCOUNTER — Ambulatory Visit (INDEPENDENT_AMBULATORY_CARE_PROVIDER_SITE_OTHER)
Admission: RE | Admit: 2022-06-15 | Discharge: 2022-06-15 | Disposition: A | Discharge status: Home / Self Care | Payer: Medicare PPO | Source: Ambulatory Visit | Attending: Nurse Practitioner | Admitting: Nurse Practitioner

## 2022-06-15 ENCOUNTER — Encounter: Payer: Self-pay | Admitting: Nurse Practitioner

## 2022-06-15 ENCOUNTER — Ambulatory Visit: Payer: Medicare PPO | Admitting: Nurse Practitioner

## 2022-06-15 VITALS — BP 138/92 | HR 100 | Temp 97.8°F | Resp 14 | Ht 70.0 in | Wt 272.3 lb

## 2022-06-15 DIAGNOSIS — E66812 Obesity, class 2: Secondary | ICD-10-CM | POA: Insufficient documentation

## 2022-06-15 DIAGNOSIS — F439 Reaction to severe stress, unspecified: Secondary | ICD-10-CM

## 2022-06-15 DIAGNOSIS — Z0000 Encounter for general adult medical examination without abnormal findings: Secondary | ICD-10-CM | POA: Insufficient documentation

## 2022-06-15 DIAGNOSIS — M545 Low back pain, unspecified: Secondary | ICD-10-CM

## 2022-06-15 DIAGNOSIS — F5101 Primary insomnia: Secondary | ICD-10-CM | POA: Diagnosis not present

## 2022-06-15 DIAGNOSIS — G8929 Other chronic pain: Secondary | ICD-10-CM | POA: Diagnosis not present

## 2022-06-15 DIAGNOSIS — Z6839 Body mass index (BMI) 39.0-39.9, adult: Secondary | ICD-10-CM | POA: Diagnosis not present

## 2022-06-15 DIAGNOSIS — I10 Essential (primary) hypertension: Secondary | ICD-10-CM | POA: Diagnosis not present

## 2022-06-15 DIAGNOSIS — Z9884 Bariatric surgery status: Secondary | ICD-10-CM | POA: Insufficient documentation

## 2022-06-15 LAB — HEMOGLOBIN A1C: Hgb A1c MFr Bld: 6.2 % (ref 4.6–6.5)

## 2022-06-15 LAB — COMPREHENSIVE METABOLIC PANEL
ALT: 12 U/L (ref 0–35)
AST: 15 U/L (ref 0–37)
Albumin: 3.9 g/dL (ref 3.5–5.2)
Alkaline Phosphatase: 132 U/L — ABNORMAL HIGH (ref 39–117)
BUN: 11 mg/dL (ref 6–23)
CO2: 27 mEq/L (ref 19–32)
Calcium: 9.5 mg/dL (ref 8.4–10.5)
Chloride: 106 mEq/L (ref 96–112)
Creatinine, Ser: 0.88 mg/dL (ref 0.40–1.20)
GFR: 66.81 mL/min (ref >60.00)
Glucose, Bld: 100 mg/dL — ABNORMAL HIGH (ref 70–99)
Potassium: 4.2 mEq/L (ref 3.5–5.1)
Sodium: 141 mEq/L (ref 135–145)
Total Bilirubin: 0.3 mg/dL (ref 0.2–1.2)
Total Protein: 6.7 g/dL (ref 6.0–8.3)

## 2022-06-15 LAB — CBC
HCT: 36.1 % (ref 36.0–46.0)
Hemoglobin: 11.6 g/dL — ABNORMAL LOW (ref 12.0–15.0)
MCHC: 32 g/dL (ref 30.0–36.0)
MCV: 84.3 fl (ref 78.0–100.0)
Platelets: 342 10*3/uL (ref 150.0–400.0)
RBC: 4.29 Mil/uL (ref 3.87–5.11)
RDW: 15.1 % (ref 11.5–15.5)
WBC: 8.2 10*3/uL (ref 4.0–10.5)

## 2022-06-15 LAB — TSH: TSH: 0.98 u[IU]/mL (ref 0.35–5.50)

## 2022-06-15 LAB — VITAMIN B12: Vitamin B-12: 79 pg/mL — ABNORMAL LOW (ref 211–911)

## 2022-06-15 MED ORDER — ESZOPICLONE 2 MG PO TABS: 2.0000 mg | ORAL_TABLET | Freq: Every evening | ORAL | 0 refills | Status: DC | PRN

## 2022-06-15 MED ORDER — BUSPIRONE HCL 10 MG PO TABS: 10.0000 mg | ORAL_TABLET | Freq: Two times a day (BID) | ORAL | 1 refills | Status: DC

## 2022-06-15 MED ORDER — AMLODIPINE BESYLATE 5 MG PO TABS: 5.0000 mg | ORAL_TABLET | Freq: Every day | ORAL | 1 refills | Status: DC

## 2022-06-15 MED ORDER — LISINOPRIL 10 MG PO TABS: 10.0000 mg | ORAL_TABLET | Freq: Every day | ORAL | 1 refills | Status: DC

## 2022-06-15 MED ORDER — METOPROLOL TARTRATE 50 MG PO TABS: 50.0000 mg | ORAL_TABLET | Freq: Two times a day (BID) | ORAL | 1 refills | Status: DC

## 2022-06-15 NOTE — Assessment & Plan Note (Signed)
Has been evaluated by her previous primary care provider along with orthopedist.  States that she has not had injections in the back previously but has had an MRI performed.  We will obtain plain films today she states sometimes she needs pain medication greater than Tylenol and strength.  Pending results along with driven by patient need likely try tramadol to begin with

## 2022-06-15 NOTE — Assessment & Plan Note (Signed)
Patient currently on buspirone and states it was for her blood pressure.  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  She was started on this medication that did seem to help with that.  Likely stress related from caregiver strain we will continue medication as patient seems to be tolerating well

## 2022-06-15 NOTE — Assessment & Plan Note (Signed)
Patient has tried several medications in the past inclusive of Ambien 5 mg, 10 mg.  Trazodone and melatonin.  States cardiologist recommended Lunesta but was unable to afford it time as her insurance did not cover it and was paying large amounts of money out of pocket for her sick husband's medications.  States that she has changed insurance companies and her husband has since passed away.  Did discuss that if Lunesta 2 mg was approved she needs to discontinue Ambien.  Prescription sent in today

## 2022-06-15 NOTE — Progress Notes (Signed)
New Patient Office Visit  Subjective    Patient ID: Caroline Vazquez, female    DOB: Apr 15, 1951  Age: 71 y.o. MRN: [id number]  CC:  Chief Complaint  Patient presents with   Establish Care    Previous PCP with Bobbye Morton, [place]   Insomnia    Takes Ambien-would like to discuss this. Not sleeping the whole night. She has tried Melatonin in the past-about 10 mg, Trazodone at the highest dose and it did not help.    HPI Caroline Vazquez presents to establish care  HTN: Checks blood pressure at home when she feels off. Tolerates medications well. Lisinopril, amlodipine, metoprolol  Vitamin D: weekly and tolerates it well.  Ortho: Dr Mins. Left knee replacement. States the right one needs to be replaced also.   Anxiety: patient is on buspr for her blood pressure she says. States that since 2003-2018 she had  Insomnia: currently on Ambien has tired '5mg'$  and currently on '10mg'$ and has tried trazodone and melatonin. She states that her cardiologist recommended lunesta Takes it at 10 get up around 2 and reads some then will go back to bed. Goes to bed 4-430 and gets up 6-630.  Back: She has had an mri in the past and has been evaluated by orho in the past. No lumbar surgery she has not had injections into the back.  Colonoscopy: unsure of the last time thinks it has been within 10 years. Mammogram: been over 5 years DEXA:prior to Roux en y. Over 2 years  Outpatient Encounter Medications as of 06/15/2022  Medication Sig   acetaminophen (TYLENOL) 500 MG tablet Take 1,000 mg by mouth every 6 (six) hours as needed (for pain.).   eszopiclone (LUNESTA) 2 MG TABS tablet Take 1 tablet (2 mg total) by mouth at bedtime as needed for sleep. Take immediately before bedtime   oxyCODONE-acetaminophen (PERCOCET/ROXICET) 5-325 MG tablet Take 1 tablet by mouth every 6 (six) hours as needed. For pain.   Vitamin D, Ergocalciferol, (DRISDOL) 1.25 MG (50000 UNIT) CAPS capsule Take 50,000 Units by mouth once a week.    zolpidem (AMBIEN) 10 MG tablet Take 1 tablet by mouth at bedtime.   [DISCONTINUED] amLODipine (NORVASC) 5 MG tablet Take 1 tablet by mouth daily.   [DISCONTINUED] busPIRone (BUSPAR) 10 MG tablet Take 10 mg by mouth 2 (two) times daily.   [DISCONTINUED] lisinopril (PRINIVIL,ZESTRIL) 10 MG tablet Take 10 mg by mouth daily.   [DISCONTINUED] metoprolol tartrate (LOPRESSOR) 50 MG tablet Take 50 mg by mouth 2 (two) times daily.   amLODipine (NORVASC) 5 MG tablet Take 1 tablet (5 mg total) by mouth daily.   busPIRone (BUSPAR) 10 MG tablet Take 1 tablet (10 mg total) by mouth 2 (two) times daily.   lisinopril (ZESTRIL) 10 MG tablet Take 1 tablet (10 mg total) by mouth daily.   metoprolol tartrate (LOPRESSOR) 50 MG tablet Take 1 tablet (50 mg total) by mouth 2 (two) times daily.   [DISCONTINUED] amLODipine (NORVASC) 5 MG tablet Take 5 mg by mouth 2 (two) times daily.   [DISCONTINUED] enoxaparin (LOVENOX) 40 MG/0.4ML injection Inject 0.4 mLs (40 mg total) into the skin daily.   [DISCONTINUED] metoprolol succinate (TOPROL-XL) 100 MG 24 hr tablet Take 50 mg by mouth 2 (two) times daily.   [DISCONTINUED] oxyCODONE (OXY IR/ROXICODONE) 5 MG immediate release tablet Take 1-2 tablets (5-10 mg total) by mouth every 4 (four) hours as needed for severe pain or breakthrough pain.   [DISCONTINUED] traMADol (ULTRAM) 50 MG  tablet Take 1-2 tablets (50-100 mg total) by mouth every 6 (six) hours as needed for moderate pain.   No facility-administered encounter medications on file as of 06/15/2022.    Past Medical History:  Diagnosis Date   Allergy As a child   Seasonal allergies   Anxiety    Arthritis    Cancer ([place])    Right Kidney   Chronic kidney disease    Goiter, nodular    Hypertension     Past Surgical History:  Procedure Laterality Date   ABDOMINAL HYSTERECTOMY     ANTERIOR CERVICAL DECOMP/DISCECTOMY FUSION     BREAST SURGERY Bilateral    CHOLECYSTECTOMY     EYE SURGERY  2020   LAPAROSCOPIC  NEPHRECTOMY Right 2014   [facility], [place]   LAPAROSCOPIC ROUX-EN-Y GASTRIC BYPASS WITH HIATAL HERNIA REPAIR  2005   SPINE SURGERY  2008?   Disk fusion   TONSILLECTOMY     TOTAL KNEE ARTHROPLASTY Left 07/20/2017   Procedure: TOTAL KNEE ARTHROPLASTY;  Surgeon: Hessie Knows, MD;  Location: [facility] ORS;  Service: Orthopedics;  Laterality: Left;    Family History  Problem Relation Age of Onset   COPD Mother    Diabetes Father    Hypertension Father    Kidney cancer Father    Heart disease Father    Aortic aneurysm Father    Kidney disease Father    Other Brother        brain tumor   Other Brother        valve replacement   Aortic aneurysm Brother        x 2   Kidney disease Brother    Heart attack Maternal Grandfather 52    Social History   Socioeconomic History   Marital status: Widowed    Spouse name: Not on file   Number of children: 1   Years of education: Not on file   Highest education level: Bachelor's degree (e.g., BA, AB, BS)  Occupational History   Not on file  Tobacco Use   Smoking status: Never   Smokeless tobacco: Never  Vaping Use   Vaping Use: Never used  Substance and Sexual Activity   Alcohol use: No   Drug use: No   Sexual activity: Not Currently    Birth control/protection: None  Other Topics Concern   Not on file  Social History Narrative   Retired      Pharmacist, hospital at [employer] Strain: Not on file  Food Insecurity: Not on file  Transportation Needs: Not on file  Physical Activity: Not on file  Stress: Not on file  Social Connections: Not on file  Intimate Partner Violence: Not on file    Review of Systems  Constitutional:  Positive for malaise/fatigue. Negative for chills and fever.  Respiratory:  Negative for cough and shortness of breath.   Cardiovascular:  Positive for leg swelling. Negative for chest pain.  Gastrointestinal:  Negative for abdominal pain, constipation, nausea and  vomiting.       BM daily   Genitourinary:  Negative for dysuria and hematuria.  Neurological:  Negative for dizziness, tingling (numbness to left knee s/p knee surgery) and headaches.  Psychiatric/Behavioral:  Negative for hallucinations and suicidal ideas. The patient has insomnia.         Objective    BP (!) 138/92   Pulse 100   Temp 97.8 F (36.6 C)   Resp 14   Ht 5'  10" (1.778 m)   Wt 272 lb 5 oz (123.5 kg)   SpO2 97%   BMI 39.07 kg/m   Physical Exam Vitals and nursing note reviewed.  Constitutional:      Appearance: Normal appearance. She is obese.  HENT:     Right Ear: Tympanic membrane, ear canal and external ear normal.     Left Ear: Tympanic membrane, ear canal and external ear normal.     Mouth/Throat:     Mouth: Mucous membranes are moist.     Pharynx: Oropharynx is clear.  Eyes:     Extraocular Movements: Extraocular movements intact.     Pupils: Pupils are equal, round, and reactive to light.     Comments: Wears corrective   Cardiovascular:     Rate and Rhythm: Normal rate and regular rhythm.     Pulses: Normal pulses.     Heart sounds: Normal heart sounds.  Pulmonary:     Effort: Pulmonary effort is normal.     Breath sounds: Normal breath sounds.  Musculoskeletal:     Lumbar back: Tenderness and bony tenderness present.       Back:     Right lower leg: Edema present.     Left lower leg: Edema present.  Lymphadenopathy:     Cervical: No cervical adenopathy.  Skin:    General: Skin is warm.  Neurological:     General: No focal deficit present.     Mental Status: She is alert.     Deep Tendon Reflexes:     Reflex Scores:      Bicep reflexes are 2+ on the right side and 2+ on the left side.      Patellar reflexes are 1+ on the right side and 1+ on the left side.    Comments: Bilateral upper and lower extremity strength 5/5  Psychiatric:        Mood and Affect: Mood normal.        Behavior: Behavior normal.        Thought Content: Thought  content normal.        Judgment: Judgment normal.         Assessment & Plan:   Problem List Items Addressed This Visit       Cardiovascular and Mediastinum   Primary hypertension    Patient currently maintained on amlodipine, lisinopril, metoprolol.  Blood pressure slightly above goal today but patient is new patient does check blood pressure at home per her report is within normal limits.  Continue medication as prescribed continue checking blood pressure at home.      Relevant Medications   amLODipine (NORVASC) 5 MG tablet   lisinopril (ZESTRIL) 10 MG tablet   metoprolol tartrate (LOPRESSOR) 50 MG tablet   Other Relevant Orders   TSH     Other   Encounter for medical examination to establish care - Primary    Record release requested      Relevant Orders   CBC   Comprehensive metabolic panel   Vitamin T24   TSH   Primary insomnia    Patient has tried several medications in the past inclusive of Ambien 5 mg, 10 mg.  Trazodone and melatonin.  States cardiologist recommended Lunesta but was unable to afford it time as her insurance did not cover it and was paying large amounts of money out of pocket for her sick husband's medications.  States that she has changed insurance companies and her husband has since passed away.  Did discuss  that if Lunesta 2 mg was approved she needs to discontinue Ambien.  Prescription sent in today      Relevant Medications   eszopiclone (LUNESTA) 2 MG TABS tablet   Other Relevant Orders   TSH   Chronic right-sided low back pain without sciatica    Has been evaluated by her previous primary care provider along with orthopedist.  States that she has not had injections in the back previously but has had an MRI performed.  We will obtain plain films today she states sometimes she needs pain medication greater than Tylenol and strength.  Pending results along with driven by patient need likely try tramadol to begin with      Relevant Orders   DG  Lumbar Spine Complete   Class 2 severe obesity due to excess calories with serious comorbidity and body mass index (BMI) of 39.0 to 39.9 in adult [facility])    Patient has several orthopedic comorbidities.  We will check labs inclusive of A1c today      Relevant Orders   TSH   Hemoglobin A1c   Stress    Patient currently on buspirone and states it was for her blood pressure.  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  She was started on this medication that did seem to help with that.  Likely stress related from caregiver strain we will continue medication as patient seems to be tolerating well      Relevant Medications   busPIRone (BUSPAR) 10 MG tablet   History of Roux-en-Y gastric bypass    Historical diagnosis.  Patient states she was borderline anemic does have vitamin D deficiency.  States she does not think she is ever had vitamin B12 deficiency we will start with basic labs including B12 level, pending result      Relevant Orders   Vitamin B12    Return in about 6 months (around 12/15/2022) for Recheck/physical .   Romilda Garret, NP

## 2022-06-15 NOTE — Assessment & Plan Note (Signed)
Patient has several orthopedic comorbidities.  We will check labs inclusive of A1c today

## 2022-06-15 NOTE — Patient Instructions (Signed)
Nice to see you today I will be in touch with the lab results once I have them Follow up with me in 6 months, sooner if you need me If the Caroline Vazquez gets approved we will discontinue the [place]

## 2022-06-15 NOTE — Assessment & Plan Note (Signed)
Historical diagnosis.  Patient states she was borderline anemic does have vitamin D deficiency.  States she does not think she is ever had vitamin B12 deficiency we will start with basic labs including B12 level, pending result

## 2022-06-15 NOTE — Assessment & Plan Note (Signed)
Record release requested

## 2022-06-15 NOTE — Assessment & Plan Note (Signed)
Patient currently maintained on amlodipine, lisinopril, metoprolol.  Blood pressure slightly above goal today but patient is new patient does check blood pressure at home per her report is within normal limits.  Continue medication as prescribed continue checking blood pressure at home.

## 2022-06-16 ENCOUNTER — Encounter: Payer: Self-pay | Admitting: Nurse Practitioner

## 2022-06-16 NOTE — Telephone Encounter (Signed)
She is agreeable to B21 injections. She wants to do it at home but I would like for her to have the first one to be in office to make sure the technique is right

## 2022-06-16 NOTE — Telephone Encounter (Signed)
Appointment made to be seen for the injection and the education with [facility] for 06/17/22

## 2022-06-17 ENCOUNTER — Ambulatory Visit: Payer: Medicare PPO | Admitting: Nurse Practitioner

## 2022-06-17 VITALS — BP 132/84 | HR 77 | Temp 97.4°F | Resp 14 | Ht 70.0 in | Wt 269.0 lb

## 2022-06-17 DIAGNOSIS — G8929 Other chronic pain: Secondary | ICD-10-CM

## 2022-06-17 DIAGNOSIS — E538 Deficiency of other specified B group vitamins: Secondary | ICD-10-CM | POA: Diagnosis not present

## 2022-06-17 DIAGNOSIS — M545 Low back pain, unspecified: Secondary | ICD-10-CM | POA: Diagnosis not present

## 2022-06-17 MED ORDER — "SYRINGE/NEEDLE (DISP) 25G X 1"" 3 ML MISC": 1.0000 | 0 refills | Status: DC

## 2022-06-17 MED ORDER — TRAMADOL HCL 50 MG PO TABS: 50.0000 mg | ORAL_TABLET | Freq: Two times a day (BID) | ORAL | 0 refills | Status: AC

## 2022-06-17 MED ORDER — CYANOCOBALAMIN 1000 MCG/ML IJ SOLN: INTRAMUSCULAR | 0 refills | Status: DC

## 2022-06-17 MED ORDER — CYANOCOBALAMIN 1000 MCG/ML IJ SOLN
1000.0000 ug | Freq: Once | INTRAMUSCULAR | Status: AC
  Administered 2022-06-17: 1000 ug via INTRAMUSCULAR

## 2022-06-17 NOTE — Progress Notes (Signed)
.    Established Patient Office Visit  Subjective   Patient ID: Caroline Vazquez, female    DOB: 1951-05-03  Age: 71 y.o. MRN: [id number]  Chief Complaint  Patient presents with   Patient Education    On administering B12 injections    HPI Vitamin B12 Def:  Patient was recently seen in office and noted to have a low B12 level at 79. DId inform her that it would be best to do injections to get the levels back up as she has a history of a Roux-en-Y procedure. She would like to do them at home and is in office today for teaching and first administration of B12 injection.     Review of Systems  Constitutional:  Positive for malaise/fatigue.      Objective:     BP 132/84   Pulse 77   Temp (!) 97.4 F (36.3 C)   Resp 14   Ht '5\' 10"'$  (1.778 m)   Wt 269 lb (122 kg)   SpO2 97%   BMI 38.60 kg/m    Physical Exam Not performed as this was an educational office visit  No results found for any visits on 06/17/22.    The ASCVD Risk score (Arnett DK, et al., 2019) failed to calculate for the following reasons:   Cannot find a previous HDL lab   Cannot find a previous total cholesterol lab    Assessment & Plan:   Problem List Items Addressed This Visit       Other   Chronic right-sided low back pain without sciatica - Primary   Relevant Medications   traMADol (ULTRAM) 50 MG tablet   Vitamin B12 deficiency    Did discuss appropriate hygiene and how to drop medication syringe.  Did discuss different options of where to place the medication and that the medication is to be placed intramuscularly.  Patient states she has have experience doing injections of Lovenox and B12 in the past.  I administered the first injection of the left deltoid in office.  Did give written instructions on the prescription and AVS as to how often do this.  Patient can call the office if he has any questions.  Patient tolerated the injection well      Relevant Medications   cyanocobalamin (,VITAMIN B-12,)  1000 MCG/ML injection   SYRINGE-NEEDLE, DISP, 3 ML 25G X 1" 3 ML MISC   Other Relevant Orders   Vitamin B12    Return for 3 month lab visit only. Keep appt in .    Romilda Garret, NP

## 2022-06-17 NOTE — Assessment & Plan Note (Signed)
Did discuss appropriate hygiene and how to drop medication syringe.  Did discuss different options of where to place the medication and that the medication is to be placed intramuscularly.  Patient states she has have experience doing injections of Lovenox and B12 in the past.  I administered the first injection of the left deltoid in office.  Did give written instructions on the prescription and AVS as to how often do this.  Patient can call the office if he has any questions.  Patient tolerated the injection well

## 2022-06-17 NOTE — Patient Instructions (Signed)
Nice to see you today You will give your shot once a week for the next three weeks.  Then it will be every other week for 2 doses Then once a month after that. Then you will need to have a lab drawn, in 3 months

## 2022-07-07 ENCOUNTER — Other Ambulatory Visit: Payer: Self-pay | Admitting: Nurse Practitioner

## 2022-07-07 DIAGNOSIS — G8929 Other chronic pain: Secondary | ICD-10-CM

## 2022-07-07 DIAGNOSIS — E538 Deficiency of other specified B group vitamins: Secondary | ICD-10-CM

## 2022-07-07 NOTE — Telephone Encounter (Signed)
Please see request and information in regards to the Tramadol

## 2022-07-08 ENCOUNTER — Other Ambulatory Visit: Payer: Self-pay | Admitting: Nurse Practitioner

## 2022-07-08 DIAGNOSIS — G8929 Other chronic pain: Secondary | ICD-10-CM

## 2022-07-08 MED ORDER — TRAMADOL HCL 50 MG PO TABS: 50.0000 mg | ORAL_TABLET | Freq: Two times a day (BID) | ORAL | 0 refills | Status: AC | PRN

## 2022-07-12 ENCOUNTER — Encounter: Payer: Self-pay | Admitting: Nurse Practitioner

## 2022-07-15 ENCOUNTER — Other Ambulatory Visit: Payer: Self-pay | Admitting: Nurse Practitioner

## 2022-08-25 ENCOUNTER — Ambulatory Visit (INDEPENDENT_AMBULATORY_CARE_PROVIDER_SITE_OTHER): Payer: Medicare PPO | Admitting: *Deleted

## 2022-08-25 DIAGNOSIS — Z0000 Encounter for general adult medical examination without abnormal findings: Secondary | ICD-10-CM

## 2022-08-25 NOTE — Progress Notes (Signed)
Subjective:   Caroline Vazquez is a 71 y.o. female who presents for an Initial Medicare Annual Wellness Visit.  I connected with  Despina Pole on 08/25/22 by a telephone enabled telemedicine application and verified that I am speaking with the correct person using two identifiers.   I discussed the limitations of evaluation and management by telemedicine. The patient expressed understanding and agreed to proceed.  Patient location: home  Provider location: Tele-health-home  Review of Systems     Cardiac Risk Factors include: advanced age (>20mn, >>36women);hypertension;obesity (BMI >30kg/m2)     Objective:    Today's Vitals   There is no height or weight on file to calculate BMI.     08/25/2022   12:03 PM 07/20/2017    1:00 PM 07/07/2017    2:15 PM  Advanced Directives  Does Patient Have a Medical Advance Directive? No No No  Would patient like information on creating a medical advance directive? No - Patient declined No - Patient declined No - Patient declined    Current Medications (verified) Outpatient Encounter Medications as of 08/25/2022  Medication Sig   acetaminophen (TYLENOL) 500 MG tablet Take 1,000 mg by mouth every 6 (six) hours as needed (for pain.).   amLODipine (NORVASC) 5 MG tablet Take 1 tablet (5 mg total) by mouth daily.   busPIRone (BUSPAR) 10 MG tablet Take 1 tablet (10 mg total) by mouth 2 (two) times daily.   cyanocobalamin (,VITAMIN B-12,) 1000 MCG/ML injection Once a week for 3 weeks, then once every 2 weeks for 1 month, then once a month   lisinopril (ZESTRIL) 10 MG tablet Take 1 tablet (10 mg total) by mouth daily.   metoprolol tartrate (LOPRESSOR) 50 MG tablet Take 1 tablet (50 mg total) by mouth 2 (two) times daily.   SYRINGE-NEEDLE, DISP, 3 ML 25G X 1" 3 ML MISC 1 Application by Does not apply route as directed.   Vitamin D, Ergocalciferol, (DRISDOL) 1.25 MG (50000 UNIT) CAPS capsule Take 50,000 Units by mouth once a week.   zolpidem (AMBIEN) 10  MG tablet Take 1 tablet by mouth at bedtime.   No facility-administered encounter medications on file as of 08/25/2022.    Allergies (verified) Penicillins, Nsaids, and Tolmetin   History: Past Medical History:  Diagnosis Date   Allergy As a child   Seasonal allergies   Anxiety    Arthritis    Cancer (HBertram    Right Kidney   Chronic kidney disease    Goiter, nodular    Hypertension    Oxygen deficiency    Past Surgical History:  Procedure Laterality Date   ABDOMINAL HYSTERECTOMY     ANTERIOR CERVICAL DECOMP/DISCECTOMY FUSION     BREAST SURGERY Bilateral    CHOLECYSTECTOMY     EYE SURGERY  2020   LAPAROSCOPIC NEPHRECTOMY Right 2014   [facility], [place]   LAPAROSCOPIC ROUX-EN-Y GASTRIC BYPASS WITH HIATAL HERNIA REPAIR  2005   SPINE SURGERY  2008?   Disk fusion   TONSILLECTOMY     TOTAL KNEE ARTHROPLASTY Left 07/20/2017   Procedure: TOTAL KNEE ARTHROPLASTY;  Surgeon: MHessie Knows MD;  Location: [facility] ORS;  Service: Orthopedics;  Laterality: Left;   Family History  Problem Relation Age of Onset   COPD Mother    Diabetes Father    Hypertension Father    Kidney cancer Father    Heart disease Father    Aortic aneurysm Father    Kidney disease Father    Other  Brother        brain tumor   Other Brother        valve replacement   Aortic aneurysm Brother        x 2   Kidney disease Brother    Heart attack Maternal Grandfather 93   Social History   Socioeconomic History   Marital status: Widowed    Spouse name: Not on file   Number of children: 1   Years of education: Not on file   Highest education level: Bachelor's degree (e.g., BA, AB, BS)  Occupational History   Not on file  Tobacco Use   Smoking status: Never   Smokeless tobacco: Never  Vaping Use   Vaping Use: Never used  Substance and Sexual Activity   Alcohol use: No   Drug use: No   Sexual activity: Not Currently    Birth control/protection: None  Other Topics Concern   Not on file  Social  History Narrative   Retired      Pharmacist, 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  (08/25/2022)   Overall Financial Resource Strain (CARDIA)    Difficulty of Paying Living Expenses: Not hard at all  Food Insecurity: No Food Insecurity (08/25/2022)   Hunger Vital Sign    Worried About Running Out of Food in the Last Year: Never true    Ran Out of Food in the Last Year: Never true  Transportation Needs: No Transportation Needs (08/25/2022)   PRAPARE - Hydrologist (Medical): No    Lack of Transportation (Non-Medical): No  Physical Activity: Not on file  Stress: No Stress Concern Present (08/25/2022)   [place]    Feeling of Stress : Not at all  Social Connections: Moderately Isolated (08/25/2022)   Social Connection and Isolation Panel [NHANES]    Frequency of Communication with Friends and Family: More than three times a week    Frequency of Social Gatherings with Friends and Family: More than three times a week    Attends Religious Services: More than 4 times per year    Active Member of [employer] or Organizations: No    Attends Archivist Meetings: Never    Marital Status: Widowed    Tobacco Counseling Counseling given: Not Answered   Clinical Intake:  Pre-visit preparation completed: Yes  Pain : No/denies pain     Diabetes: No  How often do you need to have someone help you when you read instructions, pamphlets, or other written materials from your doctor or pharmacy?: 1 - Never  Diabetic?  no  Interpreter Needed?: No  Information entered by :: Leroy Kennedy LPN   Activities of Daily Living    08/25/2022   12:33 PM  In your present state of health, do you have any difficulty performing the following activities:  Hearing? 0  Vision? 0  Difficulty concentrating or making decisions? 0  Walking or climbing stairs? 0   Dressing or bathing? 0  Doing errands, shopping? 0  Preparing Food and eating ? N  Using the Toilet? N  In the past six months, have you accidently leaked urine? N  Do you have problems with loss of bowel control? N  Managing your Medications? N  Managing your Finances? N  Housekeeping or managing your Housekeeping? N    Patient Care Team: Michela Pitcher, NP as PCP - General (Nurse Practitioner)  Indicate any recent Medical Services you may have received from other than [facility] providers in the past year (date may be approximate).     Assessment:   This is a routine wellness examination for Hilda.  Hearing/Vision screen Hearing Screening - Comments:: No trouble hearing Vision Screening - Comments:: Up to date Unsure name  Dietary issues and exercise activities discussed: Current Exercise Habits: The patient does not participate in regular exercise at present   Goals Addressed             This Visit's Progress    Patient Stated       Loose weight Increase activity       Depression Screen    08/25/2022   12:13 PM 06/15/2022   12:47 PM  PHQ 2/9 Scores  PHQ - 2 Score 0 0    Fall Risk    08/25/2022   12:04 PM  [place] in the past year? 1  Number falls in past yr: 0  Injury with Fall? 0  Follow up Falls evaluation completed;Education provided;Falls prevention discussed    FALL RISK PREVENTION PERTAINING TO THE HOME:  Any stairs in or around the home? Yes  If so, are there any without handrails? No  Home free of loose throw rugs in walkways, pet beds, electrical cords, etc? Yes  Adequate lighting in your home to reduce risk of falls? Yes   ASSISTIVE DEVICES UTILIZED TO PREVENT FALLS:  Life alert? No  Use of a cane, walker or w/c? Yes  Grab bars in the bathroom? Yes  Shower chair or bench in shower? Yes  Elevated toilet seat or a handicapped toilet? No   TIMED UP AND GO:  Was the test performed? No .    Cognitive Function:         08/25/2022   12:06 PM  6CIT Screen  What Year? 0 points  What month? 0 points  What time? 0 points  Count back from 20 0 points  Months in reverse 0 points  Repeat phrase 0 points  Total Score 0 points    Immunizations Immunization History  Administered Date(s) Administered   Moderna Covid-19 Vaccine Bivalent Booster 61yr & up 01/04/2021   Moderna Sars-Covid-2 Vaccination 01/20/2020, 02/17/2020    TDAP status: Up to date Per patient has requested records  Flu Vaccine status: Due, Education has been provided regarding the importance of this vaccine. Advised may receive this vaccine at local pharmacy or Health Dept. Aware to provide a copy of the vaccination record if obtained from local pharmacy or Health Dept. Verbalized acceptance and understanding.  Pneumococcal vaccine status: Due, Education has been provided regarding the importance of this vaccine. Advised may receive this vaccine at local pharmacy or Health Dept. Aware to provide a copy of the vaccination record if obtained from local pharmacy or Health Dept. Verbalized acceptance and understanding.  Covid-19 vaccine status: Information provided on how to obtain vaccines.   Qualifies for Shingles Vaccine? Yes   Zostavax completed No   Shingrix Completed?: No.    Education has been provided regarding the importance of this vaccine. Patient has been advised to call insurance company to determine out of pocket expense if they have not yet received this vaccine. Advised may also receive vaccine at local pharmacy or Health Dept. Verbalized acceptance and understanding.  Screening Tests Health Maintenance  Topic Date Due   Hepatitis C Screening  Never done   TETANUS/TDAP  Never done   COLONOSCOPY (Pts 45-414yr  Insurance coverage will need to be confirmed)  Never done   MAMMOGRAM  Never done   Zoster Vaccines- Shingrix (1 of 2) Never done   Pneumonia Vaccine 100+ Years old (1 - PCV) Never done   DEXA SCAN  Never done    COVID-19 Vaccine (4 - Moderna series) 05/04/2021   INFLUENZA VACCINE  07/28/2022   HPV VACCINES  Aged Out    Health Maintenance  Health Maintenance Due  Topic Date Due   Hepatitis C Screening  Never done   TETANUS/TDAP  Never done   COLONOSCOPY (Pts 45-53yr Insurance coverage will need to be confirmed)  Never done   MAMMOGRAM  Never done   Zoster Vaccines- Shingrix (1 of 2) Never done   Pneumonia Vaccine 70 Years old (1 - PCV) Never done   DEXA SCAN  Never done   COVID-19 Vaccine (4 - Moderna series) 05/04/2021   INFLUENZA VACCINE  07/28/2022    Colonoscopy  per patient she requested records thinks it was 5 years ago  Mammogram status: Ordered  . Pt provided with contact info and advised to call to schedule appt.   Bone Density  patient states she has had no record  Lung Cancer Screening: (Low Dose CT Chest recommended if Age 70-80years, 30 pack-year currently smoking OR have quit w/in 15years.) does not qualify.   Lung Cancer Screening Referral:   Additional Screening:  Hepatitis C Screening: does not qualify  Vision Screening: Recommended annual ophthalmology exams for early detection of glaucoma and other disorders of the eye. Is the patient up to date with their annual eye exam?  Yes  Who is the provider or what is the name of the office in which the patient attends annual eye exams? Unsure of name If pt is not established with a provider, would they like to be referred to a provider to establish care? No .   Dental Screening: Recommended annual dental exams for proper oral hygiene  Community Resource Referral / Chronic Care Management: CRR required this visit?  No   CCM required this visit?  No      Plan:     I have personally reviewed and noted the following in the patient's chart:   Medical and social history Use of alcohol, tobacco or illicit drugs  Current medications and supplements including opioid prescriptions. Patient is not currently taking  opioid prescriptions. Functional ability and status Nutritional status Physical activity Advanced directives List of other physicians Hospitalizations, surgeries, and ER visits in previous 12 months Vitals Screenings to include cognitive, depression, and falls Referrals and appointments  In addition, I have reviewed and discussed with patient certain preventive protocols, quality metrics, and best practice recommendations. A written personalized care plan for preventive services as well as general preventive health recommendations were provided to patient.     JLeroy Kennedy LPN   [id number]  Nurse Notes: patient states she has requested her records

## 2022-08-25 NOTE — Patient Instructions (Signed)
Caroline Vazquez , Thank you for taking time to come for your Medicare Wellness Visit. I appreciate your ongoing commitment to your health goals. Please review the following plan we discussed and let me know if I can assist you in the future.   Screening recommendations/referrals: Colonoscopy: up  to date Mammogram: Education provided Bone Density: Education provided Recommended yearly ophthalmology/optometry visit for glaucoma screening and checkup Recommended yearly dental visit for hygiene and checkup  Vaccinations: Influenza vaccine: Education provided Pneumococcal vaccine: Education provided Tdap vaccine: Education provided Shingles vaccine: Education provided    Advanced directives: Education provided    Next appointment: 09-21-2022 @ 10:20  LAB only visit   12-15-2022 @ 10:20   Cable   Preventive Care 60 Years and Older, Female Preventive care refers to lifestyle choices and visits with your health care provider that can promote health and wellness. What does preventive care include? A yearly physical exam. This is also called an annual well check. Dental exams once or twice a year. Routine eye exams. Ask your health care provider how often you should have your eyes checked. Personal lifestyle choices, including: Daily care of your teeth and gums. Regular physical activity. Eating a healthy diet. Avoiding tobacco and drug use. Limiting alcohol use. Practicing safe sex. Taking low-dose aspirin every day. Taking vitamin and mineral supplements as recommended by your health care provider. What happens during an annual well check? The services and screenings done by your health care provider during your annual well check will depend on your age, overall health, lifestyle risk factors, and family history of disease. Counseling  Your health care provider may ask you questions about your: Alcohol use. Tobacco use. Drug use. Emotional well-being. Home and relationship  well-being. Sexual activity. Eating habits. History of falls. Memory and ability to understand (cognition). Work and work Statistician. Reproductive health. Screening  You may have the following tests or measurements: Height, weight, and BMI. Blood pressure. Lipid and cholesterol levels. These may be checked every 5 years, or more frequently if you are over 75 years old. Skin check. Lung cancer screening. You may have this screening every year starting at age 83 if you have a 30-pack-year history of smoking and currently smoke or have quit within the past 15 years. Fecal occult blood test (FOBT) of the stool. You may have this test every year starting at age 41. Flexible sigmoidoscopy or colonoscopy. You may have a sigmoidoscopy every 5 years or a colonoscopy every 10 years starting at age 78. Hepatitis C blood test. Hepatitis B blood test. Sexually transmitted disease (STD) testing. Diabetes screening. This is done by checking your blood sugar (glucose) after you have not eaten for a while (fasting). You may have this done every 1-3 years. Bone density scan. This is done to screen for osteoporosis. You may have this done starting at age 77. Mammogram. This may be done every 1-2 years. Talk to your health care provider about how often you should have regular mammograms. Talk with your health care provider about your test results, treatment options, and if necessary, the need for more tests. Vaccines  Your health care provider may recommend certain vaccines, such as: Influenza vaccine. This is recommended every year. Tetanus, diphtheria, and acellular pertussis (Tdap, Td) vaccine. You may need a Td booster every 10 years. Zoster vaccine. You may need this after age 30. Pneumococcal 13-valent conjugate (PCV13) vaccine. One dose is recommended after age 52. Pneumococcal polysaccharide (PPSV23) vaccine. One dose is recommended after age 55. Talk  to your health care provider about which  screenings and vaccines you need and how often you need them. This information is not intended to replace advice given to you by your health care provider. Make sure you discuss any questions you have with your health care provider. Document Released: 01/10/2016 Document Revised: 09/02/2016 Document Reviewed: 10/15/2015 Elsevier Interactive Patient Education  2017 [place] Prevention in the Home Falls can cause injuries. They can happen to people of all ages. There are many things you can do to make your home safe and to help prevent falls. What can I do on the outside of my home? Regularly fix the edges of walkways and driveways and fix any cracks. Remove anything that might make you trip as you walk through a door, such as a raised step or threshold. Trim any bushes or trees on the path to your home. Use bright outdoor lighting. Clear any walking paths of anything that might make someone trip, such as rocks or tools. Regularly check to see if handrails are loose or broken. Make sure that both sides of any steps have handrails. Any raised decks and porches should have guardrails on the edges. Have any leaves, snow, or ice cleared regularly. Use sand or salt on walking paths during winter. Clean up any spills in your garage right away. This includes oil or grease spills. What can I do in the bathroom? Use night lights. Install grab bars by the toilet and in the tub and shower. Do not use towel bars as grab bars. Use non-skid mats or decals in the tub or shower. If you need to sit down in the shower, use a plastic, non-slip stool. Keep the floor dry. Clean up any water that spills on the floor as soon as it happens. Remove soap buildup in the tub or shower regularly. Attach bath mats securely with double-sided non-slip rug tape. Do not have throw rugs and other things on the floor that can make you trip. What can I do in the bedroom? Use night lights. Make sure that you have a  light by your bed that is easy to reach. Do not use any sheets or blankets that are too big for your bed. They should not hang down onto the floor. Have a firm chair that has side arms. You can use this for support while you get dressed. Do not have throw rugs and other things on the floor that can make you trip. What can I do in the kitchen? Clean up any spills right away. Avoid walking on wet floors. Keep items that you use a lot in easy-to-reach places. If you need to reach something above you, use a strong step stool that has a grab bar. Keep electrical cords out of the way. Do not use floor polish or wax that makes floors slippery. If you must use wax, use non-skid floor wax. Do not have throw rugs and other things on the floor that can make you trip. What can I do with my stairs? Do not leave any items on the stairs. Make sure that there are handrails on both sides of the stairs and use them. Fix handrails that are broken or loose. Make sure that handrails are as long as the stairways. Check any carpeting to make sure that it is firmly attached to the stairs. Fix any carpet that is loose or worn. Avoid having throw rugs at the top or bottom of the stairs. If you do have throw rugs,  attach them to the floor with carpet tape. Make sure that you have a light switch at the top of the stairs and the bottom of the stairs. If you do not have them, ask someone to add them for you. What else can I do to help prevent falls? Wear shoes that: Do not have high heels. Have rubber bottoms. Are comfortable and fit you well. Are closed at the toe. Do not wear sandals. If you use a stepladder: Make sure that it is fully opened. Do not climb a closed stepladder. Make sure that both sides of the stepladder are locked into place. Ask someone to hold it for you, if possible. Clearly mark and make sure that you can see: Any grab bars or handrails. First and last steps. Where the edge of each step  is. Use tools that help you move around (mobility aids) if they are needed. These include: Canes. Walkers. Scooters. Crutches. Turn on the lights when you go into a dark area. Replace any light bulbs as soon as they burn out. Set up your furniture so you have a clear path. Avoid moving your furniture around. If any of your floors are uneven, fix them. If there are any pets around you, be aware of where they are. Review your medicines with your doctor. Some medicines can make you feel dizzy. This can increase your chance of falling. Ask your doctor what other things that you can do to help prevent falls. This information is not intended to replace advice given to you by your health care provider. Make sure you discuss any questions you have with your health care provider. Document Released: 10/10/2009 Document Revised: 05/21/2016 Document Reviewed: 01/18/2015 Elsevier Interactive Patient Education  2017 [employer].

## 2022-09-17 ENCOUNTER — Ambulatory Visit: Payer: Medicare PPO | Admitting: Nurse Practitioner

## 2022-09-21 ENCOUNTER — Ambulatory Visit: Payer: Medicare PPO | Admitting: Nurse Practitioner

## 2022-09-21 ENCOUNTER — Other Ambulatory Visit (INDEPENDENT_AMBULATORY_CARE_PROVIDER_SITE_OTHER): Payer: Medicare PPO

## 2022-09-21 ENCOUNTER — Other Ambulatory Visit: Payer: Self-pay

## 2022-09-21 DIAGNOSIS — E538 Deficiency of other specified B group vitamins: Secondary | ICD-10-CM | POA: Diagnosis not present

## 2022-09-21 DIAGNOSIS — R7303 Prediabetes: Secondary | ICD-10-CM | POA: Diagnosis not present

## 2022-09-21 LAB — VITAMIN B12: Vitamin B-12: 157 pg/mL — ABNORMAL LOW (ref 211–911)

## 2022-09-22 ENCOUNTER — Other Ambulatory Visit: Payer: Self-pay | Admitting: Nurse Practitioner

## 2022-09-22 DIAGNOSIS — E538 Deficiency of other specified B group vitamins: Secondary | ICD-10-CM

## 2022-09-22 LAB — HEMOGLOBIN A1C: Hgb A1c MFr Bld: 6.4 % (ref 4.6–6.5)

## 2022-09-22 MED ORDER — "SYRINGE/NEEDLE (DISP) 25G X 1"" 3 ML MISC": 1.0000 | 2 refills | Status: DC

## 2022-09-22 MED ORDER — CYANOCOBALAMIN 1000 MCG/ML IJ SOLN: 1000.0000 ug | INTRAMUSCULAR | 0 refills | Status: DC

## 2022-10-07 ENCOUNTER — Other Ambulatory Visit: Payer: Self-pay | Admitting: Nurse Practitioner

## 2022-10-08 ENCOUNTER — Other Ambulatory Visit: Payer: Self-pay | Admitting: Nurse Practitioner

## 2022-10-08 ENCOUNTER — Telehealth: Payer: Self-pay | Admitting: Nurse Practitioner

## 2022-10-08 DIAGNOSIS — F5101 Primary insomnia: Secondary | ICD-10-CM

## 2022-10-08 MED ORDER — ZOLPIDEM TARTRATE 10 MG PO TABS: 10.0000 mg | ORAL_TABLET | Freq: Every day | ORAL | 0 refills | Status: DC

## 2022-10-08 NOTE — Telephone Encounter (Signed)
Please review. Refill request was denied on 10/07/22 but no reason documented. Thank you Next OV is on 12/15/2022

## 2022-10-08 NOTE — Telephone Encounter (Signed)
Called [facility] pharmacy to verify. Patient picked up last RX of Zolpidem on 09/10/22 written by Ellsworth Lennox. No more refills on file with them available or any other [place] by any providers.

## 2022-10-08 NOTE — Telephone Encounter (Signed)
Looking at the [place] she has refills left from the other provider. That is why I declined it

## 2022-10-08 NOTE — Telephone Encounter (Signed)
Caller Name: Candus Call back phone #: [phone]  MEDICATION(S):  zolpidem (AMBIEN) 10 MG tablet  Days of Med Remaining:   Has the patient contacted their pharmacy (YES/NO)? YES What did pharmacy advise?  Pharmacy stated they have sent authorizations   Preferred Pharmacy:  [place], [place]  ~~~Please advise patient/caregiver to allow 2-3 business days to process RX refills.

## 2022-11-09 ENCOUNTER — Other Ambulatory Visit: Payer: Self-pay | Admitting: Nurse Practitioner

## 2022-11-09 DIAGNOSIS — F5101 Primary insomnia: Secondary | ICD-10-CM

## 2022-11-10 MED ORDER — ZOLPIDEM TARTRATE 10 MG PO TABS: 10.0000 mg | ORAL_TABLET | Freq: Every day | ORAL | 0 refills | Status: DC

## 2022-12-15 ENCOUNTER — Ambulatory Visit: Payer: Medicare PPO | Admitting: Nurse Practitioner

## 2022-12-15 ENCOUNTER — Encounter: Payer: Self-pay | Admitting: Nurse Practitioner

## 2022-12-15 VITALS — BP 142/72 | HR 82 | Temp 97.1°F | Resp 16 | Ht 70.0 in | Wt 282.1 lb

## 2022-12-15 DIAGNOSIS — E538 Deficiency of other specified B group vitamins: Secondary | ICD-10-CM

## 2022-12-15 DIAGNOSIS — Z1231 Encounter for screening mammogram for malignant neoplasm of breast: Secondary | ICD-10-CM

## 2022-12-15 DIAGNOSIS — F439 Reaction to severe stress, unspecified: Secondary | ICD-10-CM | POA: Diagnosis not present

## 2022-12-15 DIAGNOSIS — Z0000 Encounter for general adult medical examination without abnormal findings: Secondary | ICD-10-CM | POA: Diagnosis not present

## 2022-12-15 DIAGNOSIS — R7303 Prediabetes: Secondary | ICD-10-CM

## 2022-12-15 DIAGNOSIS — Z13820 Encounter for screening for osteoporosis: Secondary | ICD-10-CM | POA: Diagnosis not present

## 2022-12-15 DIAGNOSIS — I10 Essential (primary) hypertension: Secondary | ICD-10-CM

## 2022-12-15 DIAGNOSIS — Z23 Encounter for immunization: Secondary | ICD-10-CM | POA: Diagnosis not present

## 2022-12-15 DIAGNOSIS — F5101 Primary insomnia: Secondary | ICD-10-CM

## 2022-12-15 DIAGNOSIS — Z9884 Bariatric surgery status: Secondary | ICD-10-CM | POA: Diagnosis not present

## 2022-12-15 LAB — COMPREHENSIVE METABOLIC PANEL
ALT: 11 U/L (ref 0–35)
AST: 14 U/L (ref 0–37)
Albumin: 4.2 g/dL (ref 3.5–5.2)
Alkaline Phosphatase: 131 U/L — ABNORMAL HIGH (ref 39–117)
BUN: 16 mg/dL (ref 6–23)
CO2: 25 mEq/L (ref 19–32)
Calcium: 9.4 mg/dL (ref 8.4–10.5)
Chloride: 105 mEq/L (ref 96–112)
Creatinine, Ser: 0.91 mg/dL (ref 0.40–1.20)
GFR: 63.96 mL/min (ref >60.00)
Glucose, Bld: 102 mg/dL — ABNORMAL HIGH (ref 70–99)
Potassium: 4.3 mEq/L (ref 3.5–5.1)
Sodium: 139 mEq/L (ref 135–145)
Total Bilirubin: 0.4 mg/dL (ref 0.2–1.2)
Total Protein: 7 g/dL (ref 6.0–8.3)

## 2022-12-15 LAB — LIPID PANEL
Cholesterol: 229 mg/dL — ABNORMAL HIGH (ref 0–200)
HDL: 57.3 mg/dL (ref >39.00)
LDL Cholesterol: 148 mg/dL — ABNORMAL HIGH (ref 0–99)
NonHDL: 171.93
Total CHOL/HDL Ratio: 4
Triglycerides: 119 mg/dL (ref 0.0–149.0)
VLDL: 23.8 mg/dL (ref 0.0–40.0)

## 2022-12-15 LAB — CBC
HCT: 37.3 % (ref 36.0–46.0)
Hemoglobin: 12.2 g/dL (ref 12.0–15.0)
MCHC: 32.7 g/dL (ref 30.0–36.0)
MCV: 81.1 fl (ref 78.0–100.0)
Platelets: 400 10*3/uL (ref 150.0–400.0)
RBC: 4.6 Mil/uL (ref 3.87–5.11)
RDW: 14.4 % (ref 11.5–15.5)
WBC: 7.8 10*3/uL (ref 4.0–10.5)

## 2022-12-15 LAB — VITAMIN B12: Vitamin B-12: 354 pg/mL (ref 211–911)

## 2022-12-15 LAB — HEMOGLOBIN A1C: Hgb A1c MFr Bld: 6.3 % (ref 4.6–6.5)

## 2022-12-15 LAB — VITAMIN D 25 HYDROXY (VIT D DEFICIENCY, FRACTURES): VITD: 35.32 ng/mL (ref 30.00–100.00)

## 2022-12-15 MED ORDER — LISINOPRIL 10 MG PO TABS: 10.0000 mg | ORAL_TABLET | Freq: Every day | ORAL | 1 refills | Status: DC

## 2022-12-15 MED ORDER — METOPROLOL TARTRATE 50 MG PO TABS: 50.0000 mg | ORAL_TABLET | Freq: Two times a day (BID) | ORAL | 1 refills | Status: DC

## 2022-12-15 MED ORDER — ZOLPIDEM TARTRATE 10 MG PO TABS: 10.0000 mg | ORAL_TABLET | Freq: Every day | ORAL | 0 refills | Status: DC

## 2022-12-15 MED ORDER — BUSPIRONE HCL 10 MG PO TABS: 10.0000 mg | ORAL_TABLET | Freq: Two times a day (BID) | ORAL | 1 refills | Status: DC

## 2022-12-15 MED ORDER — AMLODIPINE BESYLATE 5 MG PO TABS: 5.0000 mg | ORAL_TABLET | Freq: Every day | ORAL | 1 refills | Status: DC

## 2022-12-15 MED ORDER — TRAMADOL HCL 50 MG PO TABS: 50.0000 mg | ORAL_TABLET | Freq: Three times a day (TID) | ORAL | 0 refills | Status: AC | PRN

## 2022-12-15 NOTE — Assessment & Plan Note (Signed)
Currently maintained on Ambien 10 mg.  Patient has tried trazodone and Lunesta in the past without great effect or adverse drug event.  Continue Ambien 10 mg nightly

## 2022-12-15 NOTE — Assessment & Plan Note (Signed)
Discussed age-appropriate immunizations and screening exams.  Patient needs to update DEXA scan, mammogram, colonoscopy.  Patient given information at discharge in regards to preventative healthcare maintenance with anticipatory guidance for age range.

## 2022-12-15 NOTE — Assessment & Plan Note (Signed)
Check vitamin B12, vitamin D and CBC

## 2022-12-15 NOTE — Patient Instructions (Signed)
Nice to see you today I will be in touch with the labs once I have them Follow up with me in 6 months, sooner if you need me We did update the flu vaccine today Get the shingles vaccine at your local pharmacy

## 2022-12-15 NOTE — Assessment & Plan Note (Signed)
Patient currently on lisinopril, metoprolol, amlodipine.  Tolerating medications well.  Blood pressure slightly above goal but within normal limits at home.

## 2022-12-15 NOTE — Assessment & Plan Note (Signed)
History of gastric surgery and on iron replacement.  Pending B12 level

## 2022-12-15 NOTE — Assessment & Plan Note (Signed)
Pending A1c today

## 2022-12-15 NOTE — Progress Notes (Signed)
Established Patient Office Visit  Subjective   Patient ID: Caroline Vazquez, female    DOB: July 09, 1951  Age: 71 y.o. MRN: [id number]  Chief Complaint  Patient presents with   Annual Exam    HPI   for complete physical and follow up of chronic conditions.  QIO:NGEXBM BP at home once a day. She is on lisinopril, metoprolol and amlodopine \. Tolerating medications well  Anxiety: Buspar and doing well  Insomnia: Ambein does well when she has it. States that she did try lunesta and had an ADE. States that she had a taste of burnt coffee  Immunizations: -Tetanus: within 10 years -Influenza: needs today  -Shingles: up date at local pharmacy -Pneumonia:  -covid: moderna x2 and one booster  -HPV: aged out  Diet: [place]. States that she is eating small breakfast and dinner. She will skip lunch. States that she is drinking light pink lemonade, pepsi (cutting back), cut back on coffee. Does dirnk Exercise: No regular exercise. States her knee is bothering  Eye exam: Completes annually. Wears glasses. This year  Dental exam: Up to date  Pap Smear: aged out Mammogram: Completed in 2020   Colonoscopy:  Has cologuard.  Unsure the provider at [facility].  Informed patient to call to make sure that we get the appropriate results.  When she completes that she can complete the Lung Cancer Screening: na Dexa: Completed in DEXA  S;eep: goes to bed aroun 9-10 and will get up around 6. Feels rested some of the times   Knee pian: right sided getting worse. Has been seen by orhto approx 3 years ago. States that she needs a replacement. Has been using tylenol with out relief      Review of Systems  Constitutional:  Negative for chills and fever.  Respiratory:  Negative for shortness of breath.   Cardiovascular:  Negative for chest pain.  Gastrointestinal:  Negative for abdominal pain, nausea and vomiting.  Musculoskeletal:  Positive for joint pain.  Neurological:  Negative for headaches.       Objective:     BP (!) 142/72   Pulse 82   Temp (!) 97.1 F (36.2 C)   Resp 16   Ht '5\' 10"'$  (1.778 m)   Wt 282 lb 2 oz (128 kg)   SpO2 97%   BMI 40.48 kg/m    Physical Exam Vitals and nursing note reviewed.  Constitutional:      Appearance: Normal appearance.  HENT:     Right Ear: Tympanic membrane, ear canal and external ear normal.     Left Ear: Tympanic membrane, ear canal and external ear normal.     Mouth/Throat:     Mouth: Mucous membranes are moist.     Pharynx: Oropharynx is clear.  Eyes:     Extraocular Movements: Extraocular movements intact.     Pupils: Pupils are equal, round, and reactive to light.     Comments: Wears glasses   Cardiovascular:     Rate and Rhythm: Normal rate and regular rhythm.     Heart sounds: Normal heart sounds.  Pulmonary:     Effort: Pulmonary effort is normal.     Breath sounds: Normal breath sounds.  Abdominal:     General: Bowel sounds are normal.  Musculoskeletal:     Right lower leg: No edema.     Left lower leg: No edema.  Lymphadenopathy:     Cervical: No cervical adenopathy.  Skin:    General: Skin is warm.  Neurological:     General: No focal deficit present.     Mental Status: She is alert.     Comments: Bilateral upper and lower extremity strength 5/5      No results found for any visits on 12/15/22.    The ASCVD Risk score (Arnett DK, et al., 2019) failed to calculate for the following reasons:   Cannot find a previous HDL lab   Cannot find a previous total cholesterol lab    Assessment & Plan:   Problem List Items Addressed This Visit       Cardiovascular and Mediastinum   Primary hypertension    Patient currently on lisinopril, metoprolol, amlodipine.  Tolerating medications well.  Blood pressure slightly above goal but within normal limits at home.      Relevant Medications   metoprolol tartrate (LOPRESSOR) 50 MG tablet   amLODipine (NORVASC) 5 MG tablet   lisinopril (ZESTRIL) 10 MG  tablet     Other   Preventative health care - Primary    Discussed age-appropriate immunizations and screening exams.  Patient needs to update DEXA scan, mammogram, colonoscopy.  Patient given information at discharge in regards to preventative healthcare maintenance with anticipatory guidance for age range.      Relevant Orders   CBC   Comprehensive metabolic panel   Hemoglobin A1c   Lipid panel   Primary insomnia    Currently maintained on Ambien 10 mg.  Patient has tried trazodone and Lunesta in the past without great effect or adverse drug event.  Continue Ambien 10 mg nightly      Relevant Medications   zolpidem (AMBIEN) 10 MG tablet   Stress   Relevant Medications   busPIRone (BUSPAR) 10 MG tablet   History of Roux-en-Y gastric bypass    Check vitamin B12, vitamin D and CBC      Relevant Orders   Vitamin B12   VITAMIN D 25 Hydroxy (Vit-D Deficiency, Fractures)   Vitamin B12 deficiency    History of gastric surgery and on iron replacement.  Pending B12 level      Relevant Orders   Vitamin B12   Prediabetes    Pending A1c today      Relevant Orders   Hemoglobin A1c   Other Visit Diagnoses     Need for influenza vaccination       Relevant Orders   Flu Vaccine QUAD High Dose(Fluad) (Completed)   Screening for osteoporosis       Relevant Orders   DG Bone Density   Encounter for screening mammogram for malignant neoplasm of breast       Relevant Orders   MM Digital Screening       Return in about 6 months (around 06/16/2023) for Recheck chronic conditions .    Romilda Garret, NP

## 2022-12-16 ENCOUNTER — Encounter: Payer: Self-pay | Admitting: Nurse Practitioner

## 2022-12-16 DIAGNOSIS — E7800 Pure hypercholesterolemia, unspecified: Secondary | ICD-10-CM

## 2022-12-16 DIAGNOSIS — E538 Deficiency of other specified B group vitamins: Secondary | ICD-10-CM

## 2022-12-16 MED ORDER — "SYRINGE/NEEDLE (DISP) 25G X 1"" 3 ML MISC": 1.0000 | 2 refills | Status: AC

## 2022-12-16 MED ORDER — CYANOCOBALAMIN 1000 MCG/ML IJ SOLN: 1000.0000 ug | INTRAMUSCULAR | 1 refills | Status: DC

## 2022-12-17 ENCOUNTER — Telehealth: Payer: Self-pay | Admitting: Nurse Practitioner

## 2022-12-17 DIAGNOSIS — E538 Deficiency of other specified B group vitamins: Secondary | ICD-10-CM

## 2022-12-17 MED ORDER — CYANOCOBALAMIN 1000 MCG/ML IJ SOLN: 1000.0000 ug | INTRAMUSCULAR | 1 refills | Status: DC

## 2022-12-17 NOTE — Telephone Encounter (Signed)
The old instructions were on the script I have sent in a new one please cancel the old one

## 2022-12-17 NOTE — Telephone Encounter (Signed)
[facility] would like to verify the injection directions forcyanocobalamin (VITAMIN B12) 1000 MCG/ML injection  ,she would like to know were the old directions not removed,before the new directions were placed?

## 2022-12-17 NOTE — Telephone Encounter (Signed)
Called pharmacy and canceled the old request and let them know that a new one has been sent in.

## 2022-12-18 MED ORDER — ROSUVASTATIN CALCIUM 5 MG PO TABS: 5.0000 mg | ORAL_TABLET | Freq: Every day | ORAL | 0 refills | Status: DC

## 2022-12-18 NOTE — Addendum Note (Signed)
Addended by: Michela Pitcher on: 12/18/2022 12:51 PM   Modules accepted: Orders

## 2023-01-06 ENCOUNTER — Encounter: Payer: Self-pay | Admitting: Nurse Practitioner

## 2023-01-06 DIAGNOSIS — M1712 Unilateral primary osteoarthritis, left knee: Secondary | ICD-10-CM

## 2023-01-06 MED ORDER — TRAMADOL HCL 50 MG PO TABS: 50.0000 mg | ORAL_TABLET | Freq: Three times a day (TID) | ORAL | 0 refills | Status: DC | PRN

## 2023-01-12 ENCOUNTER — Other Ambulatory Visit: Payer: Self-pay | Admitting: Nurse Practitioner

## 2023-01-12 DIAGNOSIS — F5101 Primary insomnia: Secondary | ICD-10-CM

## 2023-01-13 MED ORDER — ZOLPIDEM TARTRATE 10 MG PO TABS: 10.0000 mg | ORAL_TABLET | Freq: Every day | ORAL | 0 refills | Status: DC

## 2023-01-25 ENCOUNTER — Other Ambulatory Visit: Payer: Self-pay | Admitting: Nurse Practitioner

## 2023-01-25 MED ORDER — TRAMADOL HCL 50 MG PO TABS: 50.0000 mg | ORAL_TABLET | Freq: Three times a day (TID) | ORAL | 0 refills | Status: AC | PRN

## 2023-02-11 ENCOUNTER — Other Ambulatory Visit: Payer: Self-pay | Admitting: Nurse Practitioner

## 2023-02-11 DIAGNOSIS — F5101 Primary insomnia: Secondary | ICD-10-CM

## 2023-02-11 MED ORDER — ZOLPIDEM TARTRATE 10 MG PO TABS: 10.0000 mg | ORAL_TABLET | Freq: Every day | ORAL | 0 refills | Status: DC

## 2023-02-12 MED ORDER — TRAMADOL HCL 50 MG PO TABS: 50.0000 mg | ORAL_TABLET | Freq: Three times a day (TID) | ORAL | 0 refills | Status: DC | PRN

## 2023-02-12 NOTE — Addendum Note (Signed)
Addended by: Michela Pitcher on: 02/12/2023 01:16 PM   Modules accepted: Orders

## 2023-03-04 ENCOUNTER — Other Ambulatory Visit: Payer: Self-pay | Admitting: Nurse Practitioner

## 2023-03-04 DIAGNOSIS — E7800 Pure hypercholesterolemia, unspecified: Secondary | ICD-10-CM

## 2023-03-04 MED ORDER — TRAMADOL HCL 50 MG PO TABS: 50.0000 mg | ORAL_TABLET | Freq: Three times a day (TID) | ORAL | 0 refills | Status: AC | PRN

## 2023-03-04 NOTE — Addendum Note (Signed)
Addended by: Michela Pitcher on: 03/04/2023 04:00 PM   Modules accepted: Orders

## 2023-03-17 ENCOUNTER — Other Ambulatory Visit: Payer: Self-pay | Admitting: Nurse Practitioner

## 2023-03-17 DIAGNOSIS — F5101 Primary insomnia: Secondary | ICD-10-CM

## 2023-03-18 ENCOUNTER — Other Ambulatory Visit: Payer: Self-pay | Admitting: Nurse Practitioner

## 2023-03-18 ENCOUNTER — Other Ambulatory Visit (INDEPENDENT_AMBULATORY_CARE_PROVIDER_SITE_OTHER): Payer: Medicare PPO

## 2023-03-18 DIAGNOSIS — F5101 Primary insomnia: Secondary | ICD-10-CM

## 2023-03-18 DIAGNOSIS — E7800 Pure hypercholesterolemia, unspecified: Secondary | ICD-10-CM | POA: Diagnosis not present

## 2023-03-18 LAB — LIPID PANEL
Cholesterol: 145 mg/dL (ref 0–200)
HDL: 54.1 mg/dL (ref >39.00)
LDL Cholesterol: 67 mg/dL (ref 0–99)
NonHDL: 91.01
Total CHOL/HDL Ratio: 3
Triglycerides: 121 mg/dL (ref 0.0–149.0)
VLDL: 24.2 mg/dL (ref 0.0–40.0)

## 2023-03-18 LAB — HEPATIC FUNCTION PANEL
ALT: 14 U/L (ref 0–35)
AST: 17 U/L (ref 0–37)
Albumin: 3.9 g/dL (ref 3.5–5.2)
Alkaline Phosphatase: 124 U/L — ABNORMAL HIGH (ref 39–117)
Bilirubin, Direct: 0.1 mg/dL (ref 0.0–0.3)
Total Bilirubin: 0.3 mg/dL (ref 0.2–1.2)
Total Protein: 7 g/dL (ref 6.0–8.3)

## 2023-03-18 MED ORDER — ZOLPIDEM TARTRATE 10 MG PO TABS: 10.0000 mg | ORAL_TABLET | Freq: Every day | ORAL | 0 refills | Status: DC

## 2023-03-18 MED ORDER — TRAMADOL HCL 50 MG PO TABS: 50.0000 mg | ORAL_TABLET | Freq: Three times a day (TID) | ORAL | 0 refills | Status: AC | PRN

## 2023-03-18 MED ORDER — ROSUVASTATIN CALCIUM 5 MG PO TABS: 5.0000 mg | ORAL_TABLET | Freq: Every day | ORAL | 0 refills | Status: DC

## 2023-04-02 MED ORDER — TRAMADOL HCL 50 MG PO TABS: 50.0000 mg | ORAL_TABLET | Freq: Three times a day (TID) | ORAL | 0 refills | Status: DC | PRN

## 2023-04-02 NOTE — Addendum Note (Signed)
Addended by: Eden Emms on: 04/02/2023 01:28 PM   Modules accepted: Orders

## 2023-04-02 NOTE — Telephone Encounter (Signed)
Can we schedule patient to discuss her pain please

## 2023-04-02 NOTE — Telephone Encounter (Signed)
Spoke to pt, scheduled ov for 04/08/23

## 2023-04-08 ENCOUNTER — Encounter: Payer: Self-pay | Admitting: Nurse Practitioner

## 2023-04-08 ENCOUNTER — Ambulatory Visit: Payer: Medicare PPO | Admitting: Nurse Practitioner

## 2023-04-08 VITALS — BP 142/78 | HR 84 | Temp 98.9°F | Resp 16 | Ht 70.0 in | Wt 286.0 lb

## 2023-04-08 DIAGNOSIS — M791 Myalgia, unspecified site: Secondary | ICD-10-CM | POA: Diagnosis not present

## 2023-04-08 DIAGNOSIS — M25562 Pain in left knee: Secondary | ICD-10-CM | POA: Diagnosis not present

## 2023-04-08 DIAGNOSIS — M25561 Pain in right knee: Secondary | ICD-10-CM

## 2023-04-08 DIAGNOSIS — G8929 Other chronic pain: Secondary | ICD-10-CM | POA: Diagnosis not present

## 2023-04-08 DIAGNOSIS — E559 Vitamin D deficiency, unspecified: Secondary | ICD-10-CM | POA: Diagnosis not present

## 2023-04-08 NOTE — Assessment & Plan Note (Signed)
Patient was maintained on 50,000 IUs weekly.  Recently finished she will transition over 2000 IUs daily and we will recheck at next office visit.

## 2023-04-08 NOTE — Assessment & Plan Note (Signed)
Has been present since starting on rosuvastatin.  Will take a 2-week holiday and patient will reach out to me to see if the myalgias are better they are better consider pravastatin or Livalo.  Patient states her family is statin intolerant

## 2023-04-08 NOTE — Assessment & Plan Note (Signed)
Patient has a history of a left knee replacement and the right knee needs to be replaced.  She has been followed by orthopedist in the past.  She is also received injections in the past.  Encouraged her to use Tylenol 650 mg 3 times daily as needed she can manage this with her tramadol if need be.  Need to avoid NSAIDs patient has 1 kidney albeit renal function is intact.  Encouraged her to follow-up with sports medicine office for further injections in the knees to help with discomfort and pain

## 2023-04-08 NOTE — Patient Instructions (Addendum)
Nice to see you today Stop taking the crestor for 2 weeks. After the 2 weeks message me on mychart and see if the pains have improved  You can take 650mg  of tylenol with or with out the tramadol I do want you to see Dr. Karleen Hampshire Copland for knee injections  I want you to take 2,000 IU of over the counter vitamin D every day  Keep your appointment with me as scheduled in June

## 2023-04-08 NOTE — Progress Notes (Signed)
Established Patient Office Visit  Subjective   Patient ID: Caroline Vazquez, female    DOB: [birth year]  Age: 72 y.o. MRN: [id number]  Chief Complaint  Patient presents with   Muscle Pain    Since starting the crestor      Muscle pain: patinent has started on crestor 5 mg in 11/2022. States that she is having mucsle tightness and muscle pains. States that it can be thigh, calf and sometimes in her neck.  Patient does have a history of neck procedure in the past.  States that her family seems to be statin intolerant that many of them have tried statin medications and were intolerant to them.  Knee pain: states that her right knee is her "bad one" and is swollen. States that the left one is the replaced knee and it feels tight. States that she tries to avoid NSAIDs because she has one kidney. States that she has had injections in the knee in the past. States it has been approx 2018    Review of Systems  Constitutional:  Negative for chills and fever.  Respiratory:  Negative for shortness of breath.   Cardiovascular:  Negative for chest pain.  Neurological:  Negative for weakness and headaches.      Objective:     BP (!) 142/78   Pulse 84   Temp 98.9 F (37.2 C)   Resp 16   Ht 5\' 10"  (1.778 m)   Wt 286 lb (129.7 kg)   SpO2 99%   BMI 41.04 kg/m  BP Readings from Last 3 Encounters:  04/08/23 (!) 142/78  12/15/22 (!) 142/72  06/17/22 132/84   Wt Readings from Last 3 Encounters:  04/08/23 286 lb (129.7 kg)  12/15/22 282 lb 2 oz (128 kg)  06/17/22 269 lb (122 kg)      Physical Exam Vitals and nursing note reviewed.  Constitutional:      Appearance: Normal appearance. She is obese.  Cardiovascular:     Rate and Rhythm: Normal rate and regular rhythm.     Pulses:          Posterior tibial pulses are 1+ on the right side and 1+ on the left side.     Heart sounds: Normal heart sounds.  Pulmonary:     Effort: Pulmonary effort is normal.     Breath sounds: Normal breath  sounds.  Neurological:     Mental Status: She is alert.     Comments: Bilateral lower extremity strength 5/5      No results found for any visits on 04/08/23.    The 10-year ASCVD risk score (Arnett DK, et al., 2019) is: 14%    Assessment & Plan:   Problem List Items Addressed This Visit       Other   Vitamin D deficiency    Patient was maintained on 50,000 IUs weekly.  Recently finished she will transition over 2000 IUs daily and we will recheck at next office visit.      Chronic pain of both knees - Primary    Patient has a history of a left knee replacement and the right knee needs to be replaced.  She has been followed by orthopedist in the past.  She is also received injections in the past.  Encouraged her to use Tylenol 650 mg 3 times daily as needed she can manage this with her tramadol if need be.  Need to avoid NSAIDs patient has 1 kidney albeit renal function is intact.  Encouraged her to follow-up with sports medicine office for further injections in the knees to help with discomfort and pain      Relevant Medications   traMADol (ULTRAM) 50 MG tablet   Myalgia    Has been present since starting on rosuvastatin.  Will take a 2-week holiday and patient will reach out to me to see if the myalgias are better they are better consider pravastatin or Livalo.  Patient states her family is statin intolerant       Return if symptoms worsen or fail to improve, for As scheduled .    Audria Nine, NP

## 2023-04-14 ENCOUNTER — Other Ambulatory Visit: Payer: Self-pay | Admitting: Nurse Practitioner

## 2023-04-14 DIAGNOSIS — M1712 Unilateral primary osteoarthritis, left knee: Secondary | ICD-10-CM

## 2023-04-14 DIAGNOSIS — F5101 Primary insomnia: Secondary | ICD-10-CM

## 2023-04-14 DIAGNOSIS — G8929 Other chronic pain: Secondary | ICD-10-CM

## 2023-04-14 MED ORDER — ZOLPIDEM TARTRATE 10 MG PO TABS: 10.0000 mg | ORAL_TABLET | Freq: Every day | ORAL | 0 refills | Status: DC

## 2023-04-14 MED ORDER — TRAMADOL HCL 50 MG PO TABS: 50.0000 mg | ORAL_TABLET | Freq: Three times a day (TID) | ORAL | 0 refills | Status: AC | PRN

## 2023-05-06 MED ORDER — TRAMADOL HCL 50 MG PO TABS: 50.0000 mg | ORAL_TABLET | Freq: Three times a day (TID) | ORAL | 0 refills | Status: AC | PRN

## 2023-05-06 NOTE — Addendum Note (Signed)
Addended by: Eden Emms on: 05/06/2023 04:48 PM   Modules accepted: Orders

## 2023-05-14 ENCOUNTER — Other Ambulatory Visit: Payer: Self-pay | Admitting: Nurse Practitioner

## 2023-05-14 DIAGNOSIS — F5101 Primary insomnia: Secondary | ICD-10-CM

## 2023-05-14 MED ORDER — ZOLPIDEM TARTRATE 10 MG PO TABS: 10.0000 mg | ORAL_TABLET | Freq: Every day | ORAL | 1 refills | Status: DC

## 2023-05-25 MED ORDER — TRAMADOL HCL 50 MG PO TABS: 50.0000 mg | ORAL_TABLET | Freq: Three times a day (TID) | ORAL | 0 refills | Status: DC | PRN

## 2023-05-25 NOTE — Addendum Note (Signed)
Addended by: Eden Emms on: 05/25/2023 01:56 PM   Modules accepted: Orders

## 2023-06-11 MED ORDER — TRAMADOL HCL 50 MG PO TABS: 50.0000 mg | ORAL_TABLET | Freq: Three times a day (TID) | ORAL | 0 refills | Status: AC | PRN

## 2023-06-11 NOTE — Addendum Note (Signed)
Addended by: Eden Emms on: 06/11/2023 03:06 PM   Modules accepted: Orders

## 2023-06-16 ENCOUNTER — Encounter: Payer: Self-pay | Admitting: Nurse Practitioner

## 2023-06-16 ENCOUNTER — Ambulatory Visit: Payer: Medicare PPO | Admitting: Nurse Practitioner

## 2023-06-16 VITALS — BP 136/76 | HR 75 | Temp 98.3°F | Resp 16 | Ht 70.0 in | Wt 283.0 lb

## 2023-06-16 DIAGNOSIS — E7800 Pure hypercholesterolemia, unspecified: Secondary | ICD-10-CM | POA: Diagnosis not present

## 2023-06-16 DIAGNOSIS — F439 Reaction to severe stress, unspecified: Secondary | ICD-10-CM | POA: Diagnosis not present

## 2023-06-16 DIAGNOSIS — D518 Other vitamin B12 deficiency anemias: Secondary | ICD-10-CM

## 2023-06-16 DIAGNOSIS — I10 Essential (primary) hypertension: Secondary | ICD-10-CM | POA: Diagnosis not present

## 2023-06-16 DIAGNOSIS — E538 Deficiency of other specified B group vitamins: Secondary | ICD-10-CM

## 2023-06-16 MED ORDER — METOPROLOL TARTRATE 50 MG PO TABS: 50.0000 mg | ORAL_TABLET | Freq: Two times a day (BID) | ORAL | 1 refills | Status: DC

## 2023-06-16 MED ORDER — BUSPIRONE HCL 10 MG PO TABS: 10.0000 mg | ORAL_TABLET | Freq: Two times a day (BID) | ORAL | 1 refills | Status: DC

## 2023-06-16 MED ORDER — PRAVASTATIN SODIUM 10 MG PO TABS
10.0000 mg | ORAL_TABLET | Freq: Every day | ORAL | 0 refills | Status: DC
Start: 2023-06-16 — End: 2023-09-13

## 2023-06-16 MED ORDER — CYANOCOBALAMIN 1000 MCG/ML IJ SOLN
1000.0000 ug | INTRAMUSCULAR | 1 refills | Status: DC
Start: 2023-06-16 — End: 2023-12-27

## 2023-06-16 MED ORDER — AMLODIPINE BESYLATE 5 MG PO TABS: 5.0000 mg | ORAL_TABLET | Freq: Every day | ORAL | 1 refills | Status: DC

## 2023-06-16 MED ORDER — LISINOPRIL 10 MG PO TABS: 10.0000 mg | ORAL_TABLET | Freq: Every day | ORAL | 1 refills | Status: DC

## 2023-06-16 NOTE — Assessment & Plan Note (Signed)
Patient on buspirone 10 mg twice daily.  Working well tolerating medication well.  Continue as prescribed

## 2023-06-16 NOTE — Patient Instructions (Signed)
Nice to see you today  I want to see you in 6 months for your physical and labs, sooner if you need me 

## 2023-06-16 NOTE — Assessment & Plan Note (Signed)
Patient currently maintained on an amlodipine 5 mg, lisinopril 10 mg, metoprolol 50 mg twice daily.  Blood pressure under good control patient tolerating medications well continue medications

## 2023-06-16 NOTE — Progress Notes (Signed)
Established Patient Office Visit  Subjective   Patient ID: Caroline Vazquez, female    DOB: August 30, 1951  Age: 72 y.o. MRN: [id number]  Chief Complaint  Patient presents with   Medical Management of Chronic Issues    HPI  HTN: Patient is currently maintained on amlodipine 5 mg, lisinopril 10 mg, metoprolol 50 mg twice daily. Does check blood pressure at home, once a day. States that she has been running good.   Insomnia: Patient currently maintained on Ambien 10 mg nightly. States that she is doing well with the medication. States that she will get 6 hours of sleep that is not continuious. States that she will get up aroun 1-2 and will get up   HLD: Patient currently maintained on rosuvastatin 5 mg daily. We did take her off the crestor ofor myaglias and that did help. We will switch her to pravastatin   Stress: Patient currently maintained on buspirone 10 mg twice daily. States that she is doing well. No HI/SI/ AVH       Review of Systems  Constitutional:  Negative for chills and fever.  Respiratory:  Negative for shortness of breath.   Cardiovascular:  Negative for chest pain.  Gastrointestinal:  Negative for abdominal pain, constipation, diarrhea, nausea and vomiting.       BM daily   Neurological:  Negative for headaches.  Psychiatric/Behavioral:  Negative for hallucinations and suicidal ideas.       Objective:     BP 136/76   Pulse 75   Temp 98.3 F (36.8 C)   Resp 16   Ht 5\' 10"  (1.778 m)   Wt 283 lb (128.4 kg)   SpO2 98%   BMI 40.61 kg/m  BP Readings from Last 3 Encounters:  06/16/23 136/76  04/08/23 (!) 142/78  12/15/22 (!) 142/72   Wt Readings from Last 3 Encounters:  06/16/23 283 lb (128.4 kg)  04/08/23 286 lb (129.7 kg)  12/15/22 282 lb 2 oz (128 kg)      Physical Exam Vitals and nursing note reviewed.  Constitutional:      Appearance: Normal appearance.  Cardiovascular:     Rate and Rhythm: Normal rate and regular rhythm.     Heart sounds:  Normal heart sounds.  Pulmonary:     Effort: Pulmonary effort is normal.     Breath sounds: Normal breath sounds.  Neurological:     Mental Status: She is alert.      No results found for any visits on 06/16/23.    The 10-year ASCVD risk score (Arnett DK, et al., 2019) is: 12.9%    Assessment & Plan:   Problem List Items Addressed This Visit       Cardiovascular and Mediastinum   Primary hypertension    Patient currently maintained on an amlodipine 5 mg, lisinopril 10 mg, metoprolol 50 mg twice daily.  Blood pressure under good control patient tolerating medications well continue medications      Relevant Medications   metoprolol tartrate (LOPRESSOR) 50 MG tablet   amLODipine (NORVASC) 5 MG tablet   lisinopril (ZESTRIL) 10 MG tablet   pravastatin (PRAVACHOL) 10 MG tablet     Other   Stress    Patient on buspirone 10 mg twice daily.  Working well tolerating medication well.  Continue as prescribed      Relevant Medications   busPIRone (BUSPAR) 10 MG tablet   Vitamin B12 deficiency    History of the same.  Patient does vitamin B12 1000 mcg  IM once monthly.  Refill provided today      Relevant Medications   cyanocobalamin (VITAMIN B12) 1000 MCG/ML injection   Pure hypercholesterolemia - Primary    History of the same.  Patient on rosuvastatin 5 mg and had the unfortunate side effect of myalgias.  Will discontinue myalgias resolved will start patient on pravastatin 10 mg.  She will reach out if she has myalgias and will consider Livalo or Repatha      Relevant Medications   metoprolol tartrate (LOPRESSOR) 50 MG tablet   amLODipine (NORVASC) 5 MG tablet   lisinopril (ZESTRIL) 10 MG tablet   pravastatin (PRAVACHOL) 10 MG tablet    Return in about 6 months (around 12/17/2023) for CPE and Labs.    Audria Nine, NP

## 2023-06-16 NOTE — Assessment & Plan Note (Signed)
History of the same.  Patient on rosuvastatin 5 mg and had the unfortunate side effect of myalgias.  Will discontinue myalgias resolved will start patient on pravastatin 10 mg.  She will reach out if she has myalgias and will consider Livalo or Repatha

## 2023-06-16 NOTE — Assessment & Plan Note (Signed)
History of the same.  Patient does vitamin B12 1000 mcg IM once monthly.  Refill provided today

## 2023-06-29 MED ORDER — TRAMADOL HCL 50 MG PO TABS: 50.0000 mg | ORAL_TABLET | Freq: Three times a day (TID) | ORAL | 0 refills | Status: AC | PRN

## 2023-06-29 NOTE — Addendum Note (Signed)
Addended by: Eden Emms on: 06/29/2023 01:15 PM   Modules accepted: Orders

## 2023-07-14 ENCOUNTER — Other Ambulatory Visit: Payer: Self-pay | Admitting: Nurse Practitioner

## 2023-07-14 DIAGNOSIS — F5101 Primary insomnia: Secondary | ICD-10-CM

## 2023-07-14 DIAGNOSIS — G8929 Other chronic pain: Secondary | ICD-10-CM

## 2023-07-14 MED ORDER — TRAMADOL HCL 50 MG PO TABS
50.0000 mg | ORAL_TABLET | Freq: Two times a day (BID) | ORAL | 0 refills | Status: DC | PRN
Start: 2023-07-14 — End: 2023-08-30

## 2023-07-14 MED ORDER — ZOLPIDEM TARTRATE 10 MG PO TABS: 10.0000 mg | ORAL_TABLET | Freq: Every day | ORAL | 1 refills | Status: DC

## 2023-08-30 ENCOUNTER — Other Ambulatory Visit: Payer: Self-pay | Admitting: Nurse Practitioner

## 2023-08-30 DIAGNOSIS — G8929 Other chronic pain: Secondary | ICD-10-CM

## 2023-08-31 MED ORDER — TRAMADOL HCL 50 MG PO TABS
50.0000 mg | ORAL_TABLET | Freq: Two times a day (BID) | ORAL | 0 refills | Status: DC | PRN
Start: 2023-08-31 — End: 2023-10-08

## 2023-09-12 ENCOUNTER — Other Ambulatory Visit: Payer: Self-pay | Admitting: Nurse Practitioner

## 2023-09-12 DIAGNOSIS — F5101 Primary insomnia: Secondary | ICD-10-CM

## 2023-09-13 ENCOUNTER — Other Ambulatory Visit: Payer: Self-pay | Admitting: Nurse Practitioner

## 2023-09-13 DIAGNOSIS — E7800 Pure hypercholesterolemia, unspecified: Secondary | ICD-10-CM

## 2023-10-08 ENCOUNTER — Other Ambulatory Visit: Payer: Self-pay | Admitting: Nurse Practitioner

## 2023-10-08 DIAGNOSIS — G8929 Other chronic pain: Secondary | ICD-10-CM

## 2023-10-14 NOTE — Telephone Encounter (Signed)
Patient called to follow up on this, please advise

## 2023-10-18 ENCOUNTER — Other Ambulatory Visit: Payer: Self-pay | Admitting: Nurse Practitioner

## 2023-10-18 DIAGNOSIS — G8929 Other chronic pain: Secondary | ICD-10-CM

## 2023-10-18 MED ORDER — TRAMADOL HCL 50 MG PO TABS: 50.0000 mg | ORAL_TABLET | Freq: Two times a day (BID) | ORAL | 0 refills | Status: DC | PRN

## 2023-10-18 NOTE — Telephone Encounter (Signed)
Refill request for   traMADol (ULTRAM) 50 MG tablet    LOV - 05/29/23 Next OV - 12/16/23 Last refill - 08/31/23 #60/0

## 2023-10-18 NOTE — Telephone Encounter (Signed)
Patient asking the status of this prescription being filled.

## 2023-10-25 ENCOUNTER — Ambulatory Visit (INDEPENDENT_AMBULATORY_CARE_PROVIDER_SITE_OTHER): Payer: Medicare PPO

## 2023-10-25 VITALS — Ht 70.0 in | Wt 270.0 lb

## 2023-10-25 DIAGNOSIS — Z1231 Encounter for screening mammogram for malignant neoplasm of breast: Secondary | ICD-10-CM

## 2023-10-25 DIAGNOSIS — Z0000 Encounter for general adult medical examination without abnormal findings: Secondary | ICD-10-CM | POA: Diagnosis not present

## 2023-10-25 NOTE — Patient Instructions (Signed)
Caroline Vazquez , Thank you for taking time to come for your Medicare Wellness Visit. I appreciate your ongoing commitment to your health goals. Please review the following plan we discussed and let me know if I can assist you in the future.   Referrals/Orders/Follow-Ups/Clinician Recommendations: Aim for 30 minutes of exercise or brisk walking, 6-8 glasses of water, and 5 servings of fruits and vegetables each day.   This is a list of the screening recommended for you and due dates:  Health Maintenance  Topic Date Due   Hepatitis C Screening  Never done   DTaP/Tdap/Td vaccine (1 - Tdap) Never done   Colon Cancer Screening  Never done   Mammogram  Never done   Zoster (Shingles) Vaccine (1 of 2) Never done   Pneumonia Vaccine (1 of 1 - PCV) Never done   DEXA scan (bone density measurement)  Never done   Flu Shot  07/29/2023   COVID-19 Vaccine (4 - 2023-24 season) 08/29/2023   Medicare Annual Wellness Visit  10/24/2024   HPV Vaccine  Aged Out    Advanced directives: (Provided) Advance directive discussed with you today. I have provided a copy for you to complete at home and have notarized. Once this is complete, please bring a copy in to our office so we can scan it into your chart. Information on Advanced Care Planning can be found at [facility] Advance Health Care Directives Advance Health Care Directives ([email or web address]    Next Medicare Annual Wellness Visit scheduled for next year: Yes  Insert Preventive Care attachment Insert FALL PREVENTION attachment if needed

## 2023-10-25 NOTE — Progress Notes (Signed)
Subjective:   Caroline Vazquez is a 72 y.o. female who presents for Medicare Annual (Subsequent) preventive examination.  Visit Complete: Virtual I connected with  Caroline Vazquez on 10/25/23 by a audio enabled telemedicine application and verified that I am speaking with the correct person using two identifiers.  Patient Location: Home  Provider Location: Home Office  I discussed the limitations of evaluation and management by telemedicine. The patient expressed understanding and agreed to proceed.  Vital Signs: Because this visit was a virtual/telehealth visit, some criteria may be missing or patient reported. Any vitals not documented were not able to be obtained and vitals that have been documented are patient reported.  Patient Medicare AWV questionnaire was completed by the patient on 10/25/2023; I have confirmed that all information answered by patient is correct and no changes since this date.  Cardiac Risk Factors include: advanced age (>3men, >55 women);dyslipidemia;hypertension     Objective:    Today's Vitals   10/25/23 1102  Weight: 270 lb (122.5 kg)  Height: 5\' 10"  (1.778 m)   Body mass index is 38.74 kg/m.     10/25/2023   11:05 AM 08/25/2022   12:03 PM 07/20/2017    1:00 PM 07/07/2017    2:15 PM  Advanced Directives  Does Patient Have a Medical Advance Directive? No No No No  Would patient like information on creating a medical advance directive? Yes (MAU/Ambulatory/Procedural Areas - Information given) No - Patient declined No - Patient declined No - Patient declined    Current Medications (verified) Outpatient Encounter Medications as of 10/25/2023  Medication Sig   acetaminophen (TYLENOL) 500 MG tablet Take 1,000 mg by mouth every 6 (six) hours as needed (for pain.).   amLODipine (NORVASC) 5 MG tablet Take 1 tablet (5 mg total) by mouth daily.   busPIRone (BUSPAR) 10 MG tablet Take 1 tablet (10 mg total) by mouth 2 (two) times daily.   Cholecalciferol  (VITAMIN D3) 50 MCG (2000 UT) capsule Take 2,000 Units by mouth daily.   cyanocobalamin (VITAMIN B12) 1000 MCG/ML injection Inject 1 mL (1,000 mcg total) into the muscle every 30 (thirty) days.   lisinopril (ZESTRIL) 10 MG tablet Take 1 tablet (10 mg total) by mouth daily.   metoprolol tartrate (LOPRESSOR) 50 MG tablet Take 1 tablet (50 mg total) by mouth 2 (two) times daily.   pravastatin (PRAVACHOL) 10 MG tablet TAKE 1 TABLET(10 MG) BY MOUTH DAILY   SYRINGE-NEEDLE, DISP, 3 ML 25G X 1" 3 ML MISC 1 Application by Does not apply route as directed.   traMADol (ULTRAM) 50 MG tablet Take 1 tablet (50 mg total) by mouth 2 (two) times daily as needed.   zolpidem (AMBIEN) 10 MG tablet TAKE 1 TABLET(10 MG) BY MOUTH AT BEDTIME   No facility-administered encounter medications on file as of 10/25/2023.    Allergies (verified) Penicillins, Nsaids, and Tolmetin   History: Past Medical History:  Diagnosis Date   Allergy As a child   Seasonal allergies   Anxiety    Arthritis    Cancer (HCC)    Right Kidney   Chronic kidney disease    Goiter, nodular    Hypertension    Oxygen deficiency    Past Surgical History:  Procedure Laterality Date   ABDOMINAL HYSTERECTOMY     ANTERIOR CERVICAL DECOMP/DISCECTOMY FUSION     BREAST SURGERY Bilateral    CHOLECYSTECTOMY     EYE SURGERY  2020   LAPAROSCOPIC NEPHRECTOMY Right 2014   [facility]  [place]   LAPAROSCOPIC ROUX-EN-Y GASTRIC BYPASS WITH HIATAL HERNIA REPAIR  2005   SPINE SURGERY  2008?   Disk fusion   TONSILLECTOMY     TOTAL KNEE ARTHROPLASTY Left 07/20/2017   Procedure: TOTAL KNEE ARTHROPLASTY;  Surgeon: Kennedy Bucker, MD;  Location: [facility] ORS;  Service: Orthopedics;  Laterality: Left;   Family History  Problem Relation Age of Onset   COPD Mother    Diabetes Father    Hypertension Father    Kidney cancer Father    Heart disease Father    Aortic aneurysm Father    Kidney disease Father    Other Brother        brain tumor   Other  Brother        valve replacement   Aortic aneurysm Brother        x 2   Kidney disease Brother    Heart attack Maternal Grandfather 84   Social History   Socioeconomic History   Marital status: Widowed    Spouse name: Not on file   Number of children: 1   Years of education: Not on file   Highest education level: Bachelor's degree (e.g., BA, AB, BS)  Occupational History   Not on file  Tobacco Use   Smoking status: Never   Smokeless tobacco: Never  Vaping Use   Vaping status: Never Used  Substance and Sexual Activity   Alcohol use: No   Drug use: No   Sexual activity: Not Currently    Birth control/protection: None  Other Topics Concern   Not on file  Social History Narrative   Retired      Runner, broadcasting/film/video at [employer] Strain: Low Risk  (10/25/2023)   Overall Financial Resource Strain (CARDIA)    Difficulty of Paying Living Expenses: Not hard at all  Food Insecurity: No Food Insecurity (10/25/2023)   Hunger Vital Sign    Worried About Running Out of Food in the Last Year: Never true    Ran Out of Food in the Last Year: Never true  Transportation Needs: No Transportation Needs (10/25/2023)   PRAPARE - Administrator, Civil Service (Medical): No    Lack of Transportation (Non-Medical): No  Physical Activity: Insufficiently Active (10/25/2023)   Exercise Vital Sign    Days of Exercise per Week: 2 days    Minutes of Exercise per Session: 20 min  Stress: No Stress Concern Present (10/25/2023)   [employer] of Occupational Health - Occupational Stress Questionnaire    Feeling of Stress : Not at all  Social Connections: Moderately Isolated (10/25/2023)   Social Connection and Isolation Panel [NHANES]    Frequency of Communication with Friends and Family: More than three times a week    Frequency of Social Gatherings with Friends and Family: More than three times a week    Attends Religious Services: More  than 4 times per year    Active Member of [employer] or Organizations: No    Attends Banker Meetings: Never    Marital Status: Widowed    Tobacco Counseling Counseling given: Not Answered   Clinical Intake:  Pre-visit preparation completed: Yes  Pain : No/denies pain     Nutritional Risks: None Diabetes: No  How often do you need to have someone help you when you read instructions, pamphlets, or other written materials from your doctor or pharmacy?: 1 - Never  Interpreter Needed?: No  Information entered  by :: Renie Ora, LPN   Activities of Daily Living    10/25/2023   11:05 AM  In your present state of health, do you have any difficulty performing the following activities:  Hearing? 0  Vision? 0  Difficulty concentrating or making decisions? 0  Walking or climbing stairs? 0  Dressing or bathing? 0  Doing errands, shopping? 0  Preparing Food and eating ? N  Using the Toilet? N  In the past six months, have you accidently leaked urine? N  Do you have problems with loss of bowel control? N  Managing your Medications? N  Managing your Finances? N  Housekeeping or managing your Housekeeping? N    Patient Care Team: Eden Emms, NP as PCP - General (Nurse Practitioner)  Indicate any recent Medical Services you may have received from other than [facility] providers in the past year (date may be approximate).     Assessment:   This is a routine wellness examination for Henlie.  Hearing/Vision screen Vision Screening - Comments:: Wears rx glasses - up to date with routine eye exams with  Dr.ridder    Goals Addressed             This Visit's Progress    DIET - EAT MORE FRUITS AND VEGETABLES         Depression Screen    10/25/2023   11:04 AM 04/08/2023    9:40 AM 08/25/2022   12:13 PM 06/15/2022   12:47 PM  PHQ 2/9 Scores  PHQ - 2 Score 0 2 0 0  PHQ- 9 Score  5      Fall Risk    10/25/2023   11:02 AM 04/08/2023    9:40 AM 08/25/2022    12:04 PM  Fall Risk   Falls in the past year? 0 1 1  Number falls in past yr: 0 0 0  Injury with Fall? 0 0 0  Risk for fall due to : No Fall Risks No Fall Risks   Follow up Falls prevention discussed Falls evaluation completed Falls evaluation completed;Education provided;Falls prevention discussed    MEDICARE RISK AT HOME: Medicare Risk at Home Any stairs in or around the home?: Yes If so, are there any without handrails?: No Home free of loose throw rugs in walkways, pet beds, electrical cords, etc?: Yes Adequate lighting in your home to reduce risk of falls?: Yes Life alert?: No Use of a cane, walker or w/c?: No Grab bars in the bathroom?: Yes Shower chair or bench in shower?: No Elevated toilet seat or a handicapped toilet?: No  TIMED UP AND GO:  Was the test performed?  No    Cognitive Function:        10/25/2023   11:05 AM 08/25/2022   12:06 PM  6CIT Screen  What Year? 0 points 0 points  What month? 0 points 0 points  What time? 0 points 0 points  Count back from 20 0 points 0 points  Months in reverse 0 points 0 points  Repeat phrase 0 points 0 points  Total Score 0 points 0 points    Immunizations Immunization History  Administered Date(s) Administered   Fluad Quad(high Dose 65+) 12/15/2022   Moderna Covid-19 Vaccine Bivalent Booster 69yrs & up 01/04/2021   Moderna Sars-Covid-2 Vaccination 01/20/2020, 02/17/2020    TDAP status: Due, Education has been provided regarding the importance of this vaccine. Advised may receive this vaccine at local pharmacy or Health Dept. Aware to provide a copy of  the vaccination record if obtained from local pharmacy or Health Dept. Verbalized acceptance and understanding.  Flu Vaccine status: Due, Education has been provided regarding the importance of this vaccine. Advised may receive this vaccine at local pharmacy or Health Dept. Aware to provide a copy of the vaccination record if obtained from local pharmacy or Health  Dept. Verbalized acceptance and understanding.  Pneumococcal vaccine status: Due, Education has been provided regarding the importance of this vaccine. Advised may receive this vaccine at local pharmacy or Health Dept. Aware to provide a copy of the vaccination record if obtained from local pharmacy or Health Dept. Verbalized acceptance and understanding.  Covid-19 vaccine status: Completed vaccines  Qualifies for Shingles Vaccine? Yes   Zostavax completed No   Shingrix Completed?: No.    Education has been provided regarding the importance of this vaccine. Patient has been advised to call insurance company to determine out of pocket expense if they have not yet received this vaccine. Advised may also receive vaccine at local pharmacy or Health Dept. Verbalized acceptance and understanding.  Screening Tests Health Maintenance  Topic Date Due   Hepatitis C Screening  Never done   DTaP/Tdap/Td (1 - Tdap) Never done   Colonoscopy  Never done   MAMMOGRAM  Never done   Zoster Vaccines- Shingrix (1 of 2) Never done   Pneumonia Vaccine 58+ Years old (1 of 1 - PCV) Never done   DEXA SCAN  Never done   INFLUENZA VACCINE  07/29/2023   COVID-19 Vaccine (4 - 2023-24 season) 08/29/2023   Medicare Annual Wellness (AWV)  10/24/2024   HPV VACCINES  Aged Out    Health Maintenance  Health Maintenance Due  Topic Date Due   Hepatitis C Screening  Never done   DTaP/Tdap/Td (1 - Tdap) Never done   Colonoscopy  Never done   MAMMOGRAM  Never done   Zoster Vaccines- Shingrix (1 of 2) Never done   Pneumonia Vaccine 56+ Years old (1 of 1 - PCV) Never done   DEXA SCAN  Never done   INFLUENZA VACCINE  07/29/2023   COVID-19 Vaccine (4 - 2023-24 season) 08/29/2023    Colorectal cancer screening: Referral to GI placed will discuss with PCP. Pt aware the office will call re: appt.  Mammogram status: Ordered 10/25/2023. Pt provided with contact info and advised to call to schedule appt.   Bone Density  status: Ordered 12/15/2022. Pt provided with contact info and advised to call to schedule appt.  Lung Cancer Screening: (Low Dose CT Chest recommended if Age 72-80 years, 20 pack-year currently smoking OR have quit w/in 15years.) does not qualify.   Lung Cancer Screening Referral: n/a  Additional Screening:  Hepatitis C Screening: does qualify;   Vision Screening: Recommended annual ophthalmology exams for early detection of glaucoma and other disorders of the eye. Is the patient up to date with their annual eye exam?  Yes  Who is the provider or what is the name of the office in which the patient attends annual eye exams? Dr.Ridder  If pt is not established with a provider, would they like to be referred to a provider to establish care? No .   Dental Screening: Recommended annual dental exams for proper oral hygiene   Community Resource Referral / Chronic Care Management: CRR required this visit?  No   CCM required this visit?  No     Plan:     I have personally reviewed and noted the following in the patient's chart:  Medical and social history Use of alcohol, tobacco or illicit drugs  Current medications and supplements including opioid prescriptions. Patient is not currently taking opioid prescriptions. Functional ability and status Nutritional status Physical activity Advanced directives List of other physicians Hospitalizations, surgeries, and ER visits in previous 12 months Vitals Screenings to include cognitive, depression, and falls Referrals and appointments  In addition, I have reviewed and discussed with patient certain preventive protocols, quality metrics, and best practice recommendations. A written personalized care plan for preventive services as well as general preventive health recommendations were provided to patient.     Lorrene Reid, LPN   78/29/5621   After Visit Summary: (MyChart) Due to this being a telephonic visit, the after visit summary  with patients personalized plan was offered to patient via MyChart   Nurse Notes: Will have Medical records sent

## 2023-12-05 ENCOUNTER — Other Ambulatory Visit: Payer: Self-pay | Admitting: Nurse Practitioner

## 2023-12-05 DIAGNOSIS — F5101 Primary insomnia: Secondary | ICD-10-CM

## 2023-12-16 ENCOUNTER — Encounter: Payer: Self-pay | Admitting: Nurse Practitioner

## 2023-12-16 ENCOUNTER — Ambulatory Visit: Payer: Medicare PPO | Admitting: Nurse Practitioner

## 2023-12-16 VITALS — BP 136/78 | HR 76 | Temp 98.1°F | Ht 67.0 in | Wt 282.1 lb

## 2023-12-16 DIAGNOSIS — E785 Hyperlipidemia, unspecified: Secondary | ICD-10-CM | POA: Diagnosis not present

## 2023-12-16 DIAGNOSIS — R7303 Prediabetes: Secondary | ICD-10-CM | POA: Diagnosis not present

## 2023-12-16 DIAGNOSIS — M25561 Pain in right knee: Secondary | ICD-10-CM

## 2023-12-16 DIAGNOSIS — M25562 Pain in left knee: Secondary | ICD-10-CM

## 2023-12-16 DIAGNOSIS — Z9884 Bariatric surgery status: Secondary | ICD-10-CM

## 2023-12-16 DIAGNOSIS — Z23 Encounter for immunization: Secondary | ICD-10-CM | POA: Diagnosis not present

## 2023-12-16 DIAGNOSIS — E538 Deficiency of other specified B group vitamins: Secondary | ICD-10-CM

## 2023-12-16 DIAGNOSIS — I10 Essential (primary) hypertension: Secondary | ICD-10-CM

## 2023-12-16 DIAGNOSIS — E559 Vitamin D deficiency, unspecified: Secondary | ICD-10-CM

## 2023-12-16 DIAGNOSIS — F5101 Primary insomnia: Secondary | ICD-10-CM | POA: Diagnosis not present

## 2023-12-16 DIAGNOSIS — G8929 Other chronic pain: Secondary | ICD-10-CM

## 2023-12-16 DIAGNOSIS — Z0000 Encounter for general adult medical examination without abnormal findings: Secondary | ICD-10-CM

## 2023-12-16 LAB — CBC
HCT: 35.3 % — ABNORMAL LOW (ref 36.0–46.0)
Hemoglobin: 11.2 g/dL — ABNORMAL LOW (ref 12.0–15.0)
MCHC: 31.8 g/dL (ref 30.0–36.0)
MCV: 81.6 fL (ref 78.0–100.0)
Platelets: 349 10*3/uL (ref 150.0–400.0)
RBC: 4.33 Mil/uL (ref 3.87–5.11)
RDW: 15.7 % — ABNORMAL HIGH (ref 11.5–15.5)
WBC: 6.5 10*3/uL (ref 4.0–10.5)

## 2023-12-16 LAB — COMPREHENSIVE METABOLIC PANEL
ALT: 12 U/L (ref 0–35)
AST: 15 U/L (ref 0–37)
Albumin: 3.9 g/dL (ref 3.5–5.2)
Alkaline Phosphatase: 116 U/L (ref 39–117)
BUN: 16 mg/dL (ref 6–23)
CO2: 25 meq/L (ref 19–32)
Calcium: 8.8 mg/dL (ref 8.4–10.5)
Chloride: 106 meq/L (ref 96–112)
Creatinine, Ser: 0.94 mg/dL (ref 0.40–1.20)
GFR: 61.08 mL/min (ref >60.00)
Glucose, Bld: 112 mg/dL — ABNORMAL HIGH (ref 70–99)
Potassium: 4.2 meq/L (ref 3.5–5.1)
Sodium: 140 meq/L (ref 135–145)
Total Bilirubin: 0.4 mg/dL (ref 0.2–1.2)
Total Protein: 6.4 g/dL (ref 6.0–8.3)

## 2023-12-16 LAB — HEMOGLOBIN A1C: Hgb A1c MFr Bld: 6.4 % (ref 4.6–6.5)

## 2023-12-16 LAB — LIPID PANEL
Cholesterol: 161 mg/dL (ref 0–200)
HDL: 53.2 mg/dL (ref >39.00)
LDL Cholesterol: 86 mg/dL (ref 0–99)
NonHDL: 108.02
Total CHOL/HDL Ratio: 3
Triglycerides: 108 mg/dL (ref 0.0–149.0)
VLDL: 21.6 mg/dL (ref 0.0–40.0)

## 2023-12-16 LAB — TSH: TSH: 1.39 u[IU]/mL (ref 0.35–5.50)

## 2023-12-16 LAB — VITAMIN B12: Vitamin B-12: 322 pg/mL (ref 211–911)

## 2023-12-16 LAB — VITAMIN D 25 HYDROXY (VIT D DEFICIENCY, FRACTURES): VITD: 29.01 ng/mL — ABNORMAL LOW (ref 30.00–100.00)

## 2023-12-16 NOTE — Progress Notes (Signed)
Established Patient Office Visit  Subjective   Patient ID: Caroline Vazquez, female    DOB: [birth year]  Age: 72 y.o. MRN: [id number]  Chief Complaint  Patient presents with   Annual Exam    HPI   for complete physical and follow up of chronic conditions.   HTN: Patient currently maintained on amlodipine 5 mg, lisinopril 10 mg, metoprolol 50 mg twice daily. States once a week or if she is feeling off  Insomnia: Patient currently maintained on Ambien 10 mg nightly.  States that she will take it aroun 9 and wake up around 1 and then go back around 2 a.m. and sleep the rest of the night  Knee pain: Patient currently maintained on tramadol 50 mg twice daily.  Doing well  GAD/Stress: Patient currently maintained on buspirone 10 mg twice daily.  Doing well  No longer seen by cardiology or nephrology  Immunizations: -Tetanus: Completed in unsure -Influenza: 12/15/2022, update today -Shingles: Discussed get local pharmacy -Pneumonia: prevnar 20 today -COVID: Original with booster  Diet: Fair diet. She is eating 1 meal a day. States that she will do a bagel for breakfast and then crackers from lunch. Coffee, water and chews ice  Exercise: No regular exercise.  Eye exam: Completes annually glasses  Dental exam: Completes semi-annually    Colonoscopy: States she had Cologuard last year?  Patient states she has a kit at home that she needs to turn in query iFOB versus Cologuard Lung Cancer Screening: N/A  Pap smear: Patient is followed by GYN in [facility].  No longer does Pap smears due to hysterectomy at age  Mammogram: through GYN  DEXA: Order has been placed patient never called to set up  Fall: state that last week she was getting up. Sttes that she has a rugn and her toe got caught and she fell. No LOC and did not hit her head. States that she did not get seen.      Review of Systems  Constitutional:  Negative for chills and fever.  Respiratory:  Negative for  shortness of breath.   Cardiovascular:  Negative for chest pain and leg swelling.  Gastrointestinal:  Negative for abdominal pain, blood in stool, constipation, diarrhea, nausea and vomiting.       BM daily   Genitourinary:  Negative for dysuria and hematuria.  Neurological:  Negative for tingling and headaches.  Psychiatric/Behavioral:  Negative for hallucinations and suicidal ideas.       Objective:     BP 136/78   Pulse 76   Temp 98.1 F (36.7 C) (Oral)   Ht 5\' 7"  (1.702 m)   Wt 282 lb 2 oz (128 kg)   SpO2 98%   BMI 44.19 kg/m  BP Readings from Last 3 Encounters:  12/16/23 136/78  06/16/23 136/76  04/08/23 (!) 142/78   Wt Readings from Last 3 Encounters:  12/16/23 282 lb 2 oz (128 kg)  10/25/23 270 lb (122.5 kg)  06/16/23 283 lb (128.4 kg)   SpO2 Readings from Last 3 Encounters:  12/16/23 98%  06/16/23 98%  04/08/23 99%      Physical Exam Vitals and nursing note reviewed.  Constitutional:      Appearance: Normal appearance.  HENT:     Right Ear: Tympanic membrane, ear canal and external ear normal.     Left Ear: Tympanic membrane, ear canal and external ear normal.     Mouth/Throat:     Mouth: Mucous membranes are moist.  Pharynx: Oropharynx is clear.  Eyes:     Extraocular Movements: Extraocular movements intact.     Pupils: Pupils are equal, round, and reactive to light.  Cardiovascular:     Rate and Rhythm: Normal rate and regular rhythm.     Pulses: Normal pulses.     Heart sounds: Normal heart sounds.  Pulmonary:     Effort: Pulmonary effort is normal.     Breath sounds: Normal breath sounds.  Abdominal:     General: Bowel sounds are normal. There is no distension.     Palpations: There is no mass.     Tenderness: There is no abdominal tenderness.     Hernia: No hernia is present.    Musculoskeletal:     Right lower leg: No edema.     Left lower leg: No edema.  Lymphadenopathy:     Cervical: No cervical adenopathy.  Skin:    General:  Skin is warm.  Neurological:     General: No focal deficit present.     Mental Status: She is alert.     Deep Tendon Reflexes:     Reflex Scores:      Bicep reflexes are 2+ on the right side and 2+ on the left side.      Patellar reflexes are 1+ on the right side and 1+ on the left side.    Comments: Bilateral upper and lower extremity strength 5/5  Psychiatric:        Mood and Affect: Mood normal.        Behavior: Behavior normal.        Thought Content: Thought content normal.        Judgment: Judgment normal.      No results found for any visits on 12/16/23.    The 10-year ASCVD risk score (Arnett DK, et al., 2019) is: 14.5%    Assessment & Plan:   Problem List Items Addressed This Visit       Cardiovascular and Mediastinum   Primary hypertension   Patient currently maintained on amlodipine 5 mg, lisinopril 10 mg, metoprolol 50 mg twice daily.  Patient's blood pressure well-controlled.  Continue taking medication as prescribed      Relevant Orders   Lipid panel     Other   Preventative health care - Primary   Did discuss age-appropriate immunizations and screening exams.  Did review patient's personal, surgical, social, family histories.  Patient is up-to-date on all age-appropriate vaccinations she would like.  Update flu vaccine and pneumonia vaccine today.  Discussed shingles vaccine and getting it at local pharmacy.  Per report she is up-to-date with CRC screening.  Patient is up-to-date on mammogram screening per her report as she has done this through her GYN.  Patient is overdue for DEXA scan order has been placed patient is needs to contact and get scheduled.  Patient was given information at discharge about preventative healthcare maintenance with anticipatory guidance.      Relevant Orders   CBC   Comprehensive metabolic panel   TSH   Primary insomnia   Patient currently maintained on Ambien 10 mg nightly.  Continue      History of Roux-en-Y gastric  bypass   Check vitamin D, B12      Vitamin B12 deficiency   History of Roux-en-Y pending B12 level      Relevant Orders   Vitamin B12   Prediabetes   History of the same.  Pending A1c today  Relevant Orders   Hemoglobin A1c   Lipid panel   Vitamin D deficiency   History of Roux-en-Y pending vitamin D level      Relevant Orders   VITAMIN D 25 Hydroxy (Vit-D Deficiency, Fractures)   Chronic pain of both knees   Patient currently maintained on tramadol 50 mg twice daily.  Patient is tolerating medication well no constipation per her report      Hyperlipidemia   Patient currently maintained on pravastatin 10 mg daily.  Pending lipid panel today      Relevant Orders   Lipid panel   Other Visit Diagnoses       Encounter for immunization       Relevant Orders   Flu Vaccine Trivalent High Dose (Fluad) (Completed)   Pneumococcal conjugate vaccine 20-valent (Completed)     Need for pneumococcal 20-valent conjugate vaccination       Relevant Orders   Pneumococcal conjugate vaccine 20-valent (Prevnar 20)       Return in about 6 months (around 06/15/2024) for med recheck/tramadol .    Audria Nine, NP

## 2023-12-16 NOTE — Assessment & Plan Note (Signed)
History of the same.  Pending A1c today 

## 2023-12-16 NOTE — Patient Instructions (Signed)
Nice to see you today I will be in touch with the labs once I have them Follow up with me in 6 months, sooner if you need me  We updated your flu and pneumonia vaccine today

## 2023-12-16 NOTE — Assessment & Plan Note (Signed)
Did discuss age-appropriate immunizations and screening exams.  Did review patient's personal, surgical, social, family histories.  Patient is up-to-date on all age-appropriate vaccinations she would like.  Update flu vaccine and pneumonia vaccine today.  Discussed shingles vaccine and getting it at local pharmacy.  Per report she is up-to-date with CRC screening.  Patient is up-to-date on mammogram screening per her report as she has done this through her GYN.  Patient is overdue for DEXA scan order has been placed patient is needs to contact and get scheduled.  Patient was given information at discharge about preventative healthcare maintenance with anticipatory guidance.

## 2023-12-16 NOTE — Assessment & Plan Note (Signed)
History of Roux-en-Y pending vitamin D level

## 2023-12-16 NOTE — Assessment & Plan Note (Signed)
Patient currently maintained on pravastatin 10 mg daily.  Pending lipid panel today

## 2023-12-16 NOTE — Assessment & Plan Note (Signed)
History of Roux-en-Y pending B12 level

## 2023-12-16 NOTE — Assessment & Plan Note (Signed)
Patient currently maintained on amlodipine 5 mg, lisinopril 10 mg, metoprolol 50 mg twice daily.  Patient's blood pressure well-controlled.  Continue taking medication as prescribed

## 2023-12-16 NOTE — Assessment & Plan Note (Signed)
Patient currently maintained on Ambien 10 mg nightly.  Continue

## 2023-12-16 NOTE — Assessment & Plan Note (Signed)
Check vitamin D, B12

## 2023-12-16 NOTE — Assessment & Plan Note (Signed)
Patient currently maintained on tramadol 50 mg twice daily.  Patient is tolerating medication well no constipation per her report

## 2023-12-17 ENCOUNTER — Other Ambulatory Visit: Payer: Self-pay | Admitting: Nurse Practitioner

## 2023-12-17 DIAGNOSIS — G8929 Other chronic pain: Secondary | ICD-10-CM

## 2023-12-22 ENCOUNTER — Other Ambulatory Visit: Payer: Self-pay | Admitting: Nurse Practitioner

## 2023-12-22 DIAGNOSIS — R710 Precipitous drop in hematocrit: Secondary | ICD-10-CM

## 2023-12-27 ENCOUNTER — Other Ambulatory Visit: Payer: Self-pay | Admitting: Nurse Practitioner

## 2023-12-27 ENCOUNTER — Encounter: Payer: Self-pay | Admitting: Nurse Practitioner

## 2023-12-27 DIAGNOSIS — I10 Essential (primary) hypertension: Secondary | ICD-10-CM

## 2023-12-27 DIAGNOSIS — E538 Deficiency of other specified B group vitamins: Secondary | ICD-10-CM

## 2024-01-04 ENCOUNTER — Other Ambulatory Visit: Payer: Self-pay | Admitting: Nurse Practitioner

## 2024-01-04 DIAGNOSIS — F5101 Primary insomnia: Secondary | ICD-10-CM

## 2024-01-06 ENCOUNTER — Other Ambulatory Visit: Payer: Self-pay | Admitting: Nurse Practitioner

## 2024-01-06 DIAGNOSIS — F5101 Primary insomnia: Secondary | ICD-10-CM

## 2024-01-06 MED ORDER — ZOLPIDEM TARTRATE 10 MG PO TABS: 10.0000 mg | ORAL_TABLET | Freq: Every day | ORAL | 2 refills | Status: DC

## 2024-01-18 ENCOUNTER — Other Ambulatory Visit: Payer: Self-pay | Admitting: Nurse Practitioner

## 2024-01-18 DIAGNOSIS — F439 Reaction to severe stress, unspecified: Secondary | ICD-10-CM

## 2024-01-24 ENCOUNTER — Other Ambulatory Visit: Payer: Self-pay | Admitting: Nurse Practitioner

## 2024-01-24 DIAGNOSIS — G8929 Other chronic pain: Secondary | ICD-10-CM

## 2024-01-25 MED ORDER — TRAMADOL HCL 50 MG PO TABS: 50.0000 mg | ORAL_TABLET | Freq: Two times a day (BID) | ORAL | 0 refills | Status: DC | PRN

## 2024-02-04 ENCOUNTER — Telehealth: Payer: Medicare PPO | Admitting: Physician Assistant

## 2024-02-04 DIAGNOSIS — J019 Acute sinusitis, unspecified: Secondary | ICD-10-CM

## 2024-02-04 DIAGNOSIS — B9689 Other specified bacterial agents as the cause of diseases classified elsewhere: Secondary | ICD-10-CM

## 2024-02-04 MED ORDER — DOXYCYCLINE HYCLATE 100 MG PO TABS: 100.0000 mg | ORAL_TABLET | Freq: Two times a day (BID) | ORAL | 0 refills | Status: AC

## 2024-02-04 NOTE — Progress Notes (Signed)
 Virtual Visit Consent   Caroline Vazquez, you are scheduled for a virtual visit with a [facility] provider today. Just as with appointments in the office, your consent must be obtained to participate. Your consent will be active for this visit and any virtual visit you may have with one of our providers in the next 365 days. If you have a MyChart account, a copy of this consent can be sent to you electronically.  As this is a virtual visit, video technology does not allow for your provider to perform a traditional examination. This may limit your provider's ability to fully assess your condition. If your provider identifies any concerns that need to be evaluated in person or the need to arrange testing (such as labs, EKG, etc.), we will make arrangements to do so. Although advances in technology are sophisticated, we cannot ensure that it will always work on either your end or our end. If the connection with a video visit is poor, the visit may have to be switched to a telephone visit. With either a video or telephone visit, we are not always able to ensure that we have a secure connection.  By engaging in this virtual visit, you consent to the provision of healthcare and authorize for your insurance to be billed (if applicable) for the services provided during this visit. Depending on your insurance coverage, you may receive a charge related to this service.  I need to obtain your verbal consent now. Are you willing to proceed with your visit today? Caroline Vazquez has provided verbal consent on 02/04/2024 for a virtual visit (video or telephone). Delon Caroline Dickinson, PA-C  Date: 02/04/2024 6:39 PM  Virtual Visit via Video Note   I, Delon Caroline Vazquez, connected with  Caroline Vazquez  ([id number], [id number]) on 02/04/24 at  6:45 PM EST by a video-enabled telemedicine application and verified that I am speaking with the correct person using two identifiers.  Location: Patient: Virtual Visit Location  Patient: Home Provider: Virtual Visit Location Provider: Home Office   I discussed the limitations of evaluation and management by telemedicine and the availability of in person appointments. The patient expressed understanding and agreed to proceed.    History of Present Illness: Caroline Vazquez is a 73 y.o. who identifies as a female who was assigned female at birth, and is being seen today for sinus congestion.  HPI: Sinusitis This is a recurrent problem. The current episode started in the past 7 days. The problem has been gradually worsening since onset. Maximum temperature: 99. The fever has been present for Less than 1 day. The pain is moderate. Associated symptoms include congestion, ear pain (right), headaches, sinus pressure and a sore throat. Pertinent negatives include no chills, diaphoresis or shortness of breath. (Eye pressure) Past treatments include acetaminophen . The treatment provided no relief.     Problems:  Patient Active Problem List   Diagnosis Date Noted   Hyperlipidemia 12/16/2023   Pure hypercholesterolemia 06/16/2023   Vitamin D  deficiency 04/08/2023   Chronic pain of both knees 04/08/2023   Myalgia 04/08/2023   Prediabetes 12/15/2022   Vitamin B12 deficiency 06/17/2022   Preventative health care 06/15/2022   Primary hypertension 06/15/2022   Primary insomnia 06/15/2022   Chronic right-sided low back pain without sciatica 06/15/2022   Class 2 severe obesity due to excess calories with serious comorbidity and body mass index (BMI) of 39.0 to 39.9 in adult [facility]) 06/15/2022   Stress 06/15/2022   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    Nsaids Other (See Comments)    Pt only has 1 kidney    Tolmetin     Other reaction(s): Other (See Comments) Pt only has 1 kidney    Medications:  Current Outpatient Medications:    doxycycline  (VIBRA -TABS) 100 MG tablet, Take 1 tablet (100 mg total) by mouth 2 (two) times daily., Disp: 20 tablet, Rfl: 0   acetaminophen  (TYLENOL ) 500 MG tablet, Take 1,000 mg by mouth every 6 (six) hours as needed (for pain.)., Disp: , Rfl:    amLODipine  (NORVASC ) 5 MG tablet, TAKE 1 TABLET(5 MG) BY MOUTH DAILY, Disp: 90 tablet, Rfl: 1   busPIRone  (BUSPAR ) 10 MG tablet, TAKE 1 TABLET(10 MG) BY MOUTH TWICE DAILY, Disp: 180 tablet, Rfl: 1   Cholecalciferol (VITAMIN D3) 50 MCG (2000 UT) capsule, Take 2,000 Units by mouth daily., Disp: , Rfl:    cyanocobalamin  (VITAMIN B12) 1000 MCG/ML injection, ADMINISTER 1 ML(1000 MCG) IN THE MUSCLE EVERY 30 DAYS, Disp: 3 mL, Rfl: 1   lisinopril  (ZESTRIL ) 10 MG tablet, Take 1 tablet (10 mg total) by mouth daily., Disp: 90 tablet, Rfl: 1   metoprolol  tartrate (LOPRESSOR ) 50 MG tablet, Take 1 tablet (50 mg total) by mouth 2 (two) times daily., Disp: 180 tablet, Rfl: 1   pravastatin  (PRAVACHOL ) 10 MG tablet, TAKE 1 TABLET(10 MG) BY MOUTH DAILY, Disp: 90 tablet, Rfl: 1   SYRINGE-NEEDLE, DISP, 3 ML 25G X 1 3 ML MISC, 1 Application by Does not apply route as directed., Disp: 10 each, Rfl: 2   traMADol  (ULTRAM ) 50 MG tablet, Take 1 tablet (50 mg total) by mouth every 12 (twelve) hours as needed for moderate pain (pain score 4-6)., Disp: 60 tablet, Rfl: 0   zolpidem  (AMBIEN ) 10 MG tablet, Take 1 tablet (10 mg total) by mouth at bedtime., Disp: 30 tablet, Rfl: 2  Observations/Objective: Patient is well-developed,  well-nourished in no acute distress.  Resting comfortably at home.  Head is normocephalic, atraumatic.  No labored breathing.  Speech is clear and coherent with logical content.  Patient is alert and oriented at baseline.    Assessment and Plan: 1. Acute bacterial sinusitis (Primary) - doxycycline  (VIBRA -TABS) 100 MG tablet; Take 1 tablet (100 mg total) by mouth 2 (two) times daily.  Dispense: 20 tablet; Refill: 0  - Worsening symptoms that have not responded to OTC medications.  - Will give Doxycycline  - Continue allergy medications.  - Steam and humidifier can help - Stay well hydrated and get plenty of rest.  - Seek in person evaluation if no symptom improvement or if symptoms worsen   Follow Up Instructions: I discussed the assessment and treatment plan with the patient. The patient was provided an opportunity to ask questions and all were answered. The patient agreed with the plan and demonstrated an understanding of the instructions.  A copy of instructions were sent to the patient via MyChart unless otherwise noted below.  The patient was advised to call back or seek an in-person evaluation if the symptoms worsen or if the condition fails to improve as anticipated.    Delon Caroline Dickinson, PA-C

## 2024-02-04 NOTE — Patient Instructions (Signed)
 Caroline Vazquez, thank you for joining Delon CHRISTELLA Dickinson, PA-C for today's virtual visit.  While this provider is not your primary care provider (PCP), if your PCP is located in our provider database this encounter information will be shared with them immediately following your visit.   A [facility] MyChart account gives you access to today's visit and all your visits, tests, and labs performed at [facility]  click here if you don't have a [facility] MyChart account or go to [email or web address]  Consent: (Patient) Caroline Vazquez provided verbal consent for this virtual visit at the beginning of the encounter.  Current Medications:  Current Outpatient Medications:    doxycycline  (VIBRA -TABS) 100 MG tablet, Take 1 tablet (100 mg total) by mouth 2 (two) times daily., Disp: 20 tablet, Rfl: 0   acetaminophen  (TYLENOL ) 500 MG tablet, Take 1,000 mg by mouth every 6 (six) hours as needed (for pain.)., Disp: , Rfl:    amLODipine  (NORVASC ) 5 MG tablet, TAKE 1 TABLET(5 MG) BY MOUTH DAILY, Disp: 90 tablet, Rfl: 1   busPIRone  (BUSPAR ) 10 MG tablet, TAKE 1 TABLET(10 MG) BY MOUTH TWICE DAILY, Disp: 180 tablet, Rfl: 1   Cholecalciferol (VITAMIN D3) 50 MCG (2000 UT) capsule, Take 2,000 Units by mouth daily., Disp: , Rfl:    cyanocobalamin  (VITAMIN B12) 1000 MCG/ML injection, ADMINISTER 1 ML(1000 MCG) IN THE MUSCLE EVERY 30 DAYS, Disp: 3 mL, Rfl: 1   lisinopril  (ZESTRIL ) 10 MG tablet, Take 1 tablet (10 mg total) by mouth daily., Disp: 90 tablet, Rfl: 1   metoprolol  tartrate (LOPRESSOR ) 50 MG tablet, Take 1 tablet (50 mg total) by mouth 2 (two) times daily., Disp: 180 tablet, Rfl: 1   pravastatin  (PRAVACHOL ) 10 MG tablet, TAKE 1 TABLET(10 MG) BY MOUTH DAILY, Disp: 90 tablet, Rfl: 1   SYRINGE-NEEDLE, DISP, 3 ML 25G X 1 3 ML MISC, 1 Application by Does not apply route as directed., Disp: 10 each, Rfl: 2   traMADol  (ULTRAM ) 50 MG tablet, Take 1 tablet (50 mg total) by mouth every 12 (twelve)  hours as needed for moderate pain (pain score 4-6)., Disp: 60 tablet, Rfl: 0   zolpidem  (AMBIEN ) 10 MG tablet, Take 1 tablet (10 mg total) by mouth at bedtime., Disp: 30 tablet, Rfl: 2   Medications ordered in this encounter:  Meds ordered this encounter  Medications   doxycycline  (VIBRA -TABS) 100 MG tablet    Sig: Take 1 tablet (100 mg total) by mouth 2 (two) times daily.    Dispense:  20 tablet    Refill:  0    Supervising Provider:   LAMPTEY, PHILIP O [[id number]]     *If you need refills on other medications prior to your next appointment, please contact your pharmacy*  Follow-Up: Call back or seek an in-person evaluation if the symptoms worsen or if the condition fails to improve as anticipated.  [facility] [phone]  Other Instructions Sinus Infection, Adult A sinus infection, also called sinusitis, is inflammation of your sinuses. Sinuses are hollow spaces in the bones around your face. Your sinuses are located: Around your eyes. In the middle of your forehead. Behind your nose. In your cheekbones. Mucus normally drains out of your sinuses. When your nasal tissues become inflamed or swollen, mucus can become trapped or blocked. This allows bacteria, viruses, and fungi to grow, which leads to infection. Most infections of the sinuses are caused by a virus. A sinus infection can develop quickly. It can last for up  to 4 weeks (acute) or for more than 12 weeks (chronic). A sinus infection often develops after a cold. What are the causes? This condition is caused by anything that creates swelling in the sinuses or stops mucus from draining. This includes: Allergies. Asthma. Infection from bacteria or viruses. Deformities or blockages in your nose or sinuses. Abnormal growths in the nose (nasal polyps). Pollutants, such as chemicals or irritants in the air. Infection from fungi. This is rare. What increases the risk? You are more likely to develop this  condition if you: Have a weak body defense system (immune system). Do a lot of swimming or diving. Overuse nasal sprays. Smoke. What are the signs or symptoms? The main symptoms of this condition are pain and a feeling of pressure around the affected sinuses. Other symptoms include: Stuffy nose or congestion that makes it difficult to breathe through your nose. Thick yellow or greenish drainage from your nose. Tenderness, swelling, and warmth over the affected sinuses. A cough that may get worse at night. Decreased sense of smell and taste. Extra mucus that collects in the throat or the back of the nose (postnasal drip) causing a sore throat or bad breath. Tiredness (fatigue). Fever. How is this diagnosed? This condition is diagnosed based on: Your symptoms. Your medical history. A physical exam. Tests to find out if your condition is acute or chronic. This may include: Checking your nose for nasal polyps. Viewing your sinuses using a device that has a light (endoscope). Testing for allergies or bacteria. Imaging tests, such as an MRI or CT scan. In rare cases, a bone biopsy may be done to rule out more serious types of fungal sinus disease. How is this treated? Treatment for a sinus infection depends on the cause and whether your condition is chronic or acute. If caused by a virus, your symptoms should go away on their own within 10 days. You may be given medicines to relieve symptoms. They include: Medicines that shrink swollen nasal passages (decongestants). A spray that eases inflammation of the nostrils (topical intranasal corticosteroids). Rinses that help get rid of thick mucus in your nose (nasal saline washes). Medicines that treat allergies (antihistamines). Over-the-counter pain relievers. If caused by bacteria, your health care provider may recommend waiting to see if your symptoms improve. Most bacterial infections will get better without antibiotic medicine. You may be  given antibiotics if you have: A severe infection. A weak immune system. If caused by narrow nasal passages or nasal polyps, surgery may be needed. Follow these instructions at home: Medicines Take, use, or apply over-the-counter and prescription medicines only as told by your health care provider. These may include nasal sprays. If you were prescribed an antibiotic medicine, take it as told by your health care provider. Do not stop taking the antibiotic even if you start to feel better. Hydrate and humidify  Drink enough fluid to keep your urine pale yellow. Staying hydrated will help to thin your mucus. Use a cool mist humidifier to keep the humidity level in your home above 50%. Inhale steam for 10-15 minutes, 3-4 times a day, or as told by your health care provider. You can do this in the bathroom while a hot shower is running. Limit your exposure to cool or dry air. Rest Rest as much as possible. Sleep with your head raised (elevated). Make sure you get enough sleep each night. General instructions  Apply a warm, moist washcloth to your face 3-4 times a day or as told by  your health care provider. This will help with discomfort. Use nasal saline washes as often as told by your health care provider. Wash your hands often with soap and water to reduce your exposure to germs. If soap and water are not available, use hand sanitizer. Do not smoke. Avoid being around people who are smoking (secondhand smoke). Keep all follow-up visits. This is important. Contact a health care provider if: You have a fever. Your symptoms get worse. Your symptoms do not improve within 10 days. Get help right away if: You have a severe headache. You have persistent vomiting. You have severe pain or swelling around your face or eyes. You have vision problems. You develop confusion. Your neck is stiff. You have trouble breathing. These symptoms may be an emergency. Get help right away. Call 911. Do  not wait to see if the symptoms will go away. Do not drive yourself to the hospital. Summary A sinus infection is soreness and inflammation of your sinuses. Sinuses are hollow spaces in the bones around your face. This condition is caused by nasal tissues that become inflamed or swollen. The swelling traps or blocks the flow of mucus. This allows bacteria, viruses, and fungi to grow, which leads to infection. If you were prescribed an antibiotic medicine, take it as told by your health care provider. Do not stop taking the antibiotic even if you start to feel better. Keep all follow-up visits. This is important. This information is not intended to replace advice given to you by your health care provider. Make sure you discuss any questions you have with your health care provider. Document Revised: 11/18/2021 Document Reviewed: 11/18/2021 Elsevier Patient Education  2024 Elsevier Inc.   If you have been instructed to have an in-person evaluation today at a local Urgent Care facility, please use the link below. It will take you to a list of all of our available [facility], including address, phone number and hours of operation. Please do not delay care.  [facility]  If you or a family member do not have a primary care provider, use the link below to schedule a visit and establish care. When you choose a [facility] primary care physician or advanced practice provider, you gain a long-term partner in health. Find a Primary Care Provider  Learn more about [facility]'s in-office and virtual care options: [facility] - Get Care Now

## 2024-02-28 ENCOUNTER — Other Ambulatory Visit: Payer: Self-pay | Admitting: Nurse Practitioner

## 2024-02-28 DIAGNOSIS — G8929 Other chronic pain: Secondary | ICD-10-CM

## 2024-02-29 ENCOUNTER — Other Ambulatory Visit: Payer: Self-pay | Admitting: Nurse Practitioner

## 2024-02-29 DIAGNOSIS — G8929 Other chronic pain: Secondary | ICD-10-CM

## 2024-03-02 ENCOUNTER — Other Ambulatory Visit: Payer: Self-pay | Admitting: Nurse Practitioner

## 2024-03-02 DIAGNOSIS — G8929 Other chronic pain: Secondary | ICD-10-CM

## 2024-03-02 MED ORDER — TRAMADOL HCL 50 MG PO TABS: 50.0000 mg | ORAL_TABLET | Freq: Two times a day (BID) | ORAL | 0 refills | Status: DC | PRN

## 2024-03-08 ENCOUNTER — Other Ambulatory Visit: Payer: Self-pay | Admitting: Nurse Practitioner

## 2024-03-08 DIAGNOSIS — E7800 Pure hypercholesterolemia, unspecified: Secondary | ICD-10-CM

## 2024-03-13 ENCOUNTER — Other Ambulatory Visit: Payer: Self-pay | Admitting: Nurse Practitioner

## 2024-03-13 DIAGNOSIS — I10 Essential (primary) hypertension: Secondary | ICD-10-CM

## 2024-03-29 ENCOUNTER — Other Ambulatory Visit: Payer: Self-pay | Admitting: Nurse Practitioner

## 2024-03-29 DIAGNOSIS — F5101 Primary insomnia: Secondary | ICD-10-CM

## 2024-04-10 ENCOUNTER — Other Ambulatory Visit: Payer: Self-pay | Admitting: Nurse Practitioner

## 2024-04-10 DIAGNOSIS — G8929 Other chronic pain: Secondary | ICD-10-CM

## 2024-04-10 MED ORDER — TRAMADOL HCL 50 MG PO TABS: 50.0000 mg | ORAL_TABLET | Freq: Two times a day (BID) | ORAL | 0 refills | Status: DC | PRN

## 2024-05-17 ENCOUNTER — Other Ambulatory Visit: Payer: Self-pay | Admitting: Nurse Practitioner

## 2024-05-17 DIAGNOSIS — G8929 Other chronic pain: Secondary | ICD-10-CM

## 2024-06-15 ENCOUNTER — Ambulatory Visit: Payer: Medicare PPO | Admitting: Nurse Practitioner

## 2024-06-15 DIAGNOSIS — F439 Reaction to severe stress, unspecified: Secondary | ICD-10-CM | POA: Diagnosis not present

## 2024-06-15 DIAGNOSIS — M25561 Pain in right knee: Secondary | ICD-10-CM | POA: Diagnosis not present

## 2024-06-15 DIAGNOSIS — M25562 Pain in left knee: Secondary | ICD-10-CM | POA: Diagnosis not present

## 2024-06-15 DIAGNOSIS — G8929 Other chronic pain: Secondary | ICD-10-CM

## 2024-06-15 DIAGNOSIS — I10 Essential (primary) hypertension: Secondary | ICD-10-CM | POA: Diagnosis not present

## 2024-06-15 DIAGNOSIS — E7800 Pure hypercholesterolemia, unspecified: Secondary | ICD-10-CM

## 2024-06-15 MED ORDER — PRAVASTATIN SODIUM 10 MG PO TABS: 10.0000 mg | ORAL_TABLET | Freq: Every day | ORAL | 2 refills | Status: AC

## 2024-06-15 MED ORDER — METOPROLOL TARTRATE 50 MG PO TABS
50.0000 mg | ORAL_TABLET | Freq: Two times a day (BID) | ORAL | 2 refills | Status: AC
Start: 2024-06-15 — End: ?

## 2024-06-15 MED ORDER — AMLODIPINE BESYLATE 5 MG PO TABS: 5.0000 mg | ORAL_TABLET | Freq: Every day | ORAL | 2 refills | Status: AC

## 2024-06-15 MED ORDER — LISINOPRIL 10 MG PO TABS: 10.0000 mg | ORAL_TABLET | Freq: Every day | ORAL | 2 refills | Status: AC

## 2024-06-15 MED ORDER — BUSPIRONE HCL 10 MG PO TABS: 10.0000 mg | ORAL_TABLET | Freq: Two times a day (BID) | ORAL | 2 refills | Status: AC

## 2024-06-15 NOTE — Progress Notes (Signed)
 Established Patient Office Visit  Subjective   Patient ID: Caroline Vazquez, female    DOB: 1951-03-17  Age: 73 y.o. MRN: [id number]  Chief Complaint  Patient presents with   Medication Refill    Buspar , pravastatin , lisinopril , metoprolol , amlodipine , ambien , tramadol , vitamin b12 . Pt states that these meds have 0 refills on file.     HPI  Knee pain: Patient has a longstanding history of chronic knee pain.  She is currently maintained on tramadol  50 mg twice daily.  She is here for recheck.  Patient is using the tramadol  as needed.  States most the time she waits until the pains pretty bad before she takes it.  We did discuss about if she does she will have a busy day with lots of movement walking and it is safe to take medications when take it.  Patient denies any adverse drug events such as constipation or oversedated.  Insomnia: Patient currently maintained on Ambien  10 mg nightly. States that she is getting approx  State sthat she takes it around 10 and gets up arund 1 to do new yourk times puzzle and then goes backe to sleep and gets up 6-630  HTN: lisinopril  10 mg, amlodpine 10 mg, meteprolol 50 mg twice daily. She does check blood pressure at home and she will check it intermittently or when she feels off.     Review of Systems  Constitutional:  Negative for chills and fever.  Respiratory:  Negative for shortness of breath.   Cardiovascular:  Negative for chest pain.  Gastrointestinal:  Negative for abdominal pain, constipation, diarrhea, nausea and vomiting.       BM daily   Musculoskeletal:  Positive for joint pain.  Neurological:  Negative for headaches.  Psychiatric/Behavioral:  Negative for hallucinations and suicidal ideas.       Objective:     BP 118/76   Pulse 78   Temp 98.3 F (36.8 C) (Oral)   Ht 5' 7 (1.702 m)   Wt 278 lb (126.1 kg)   SpO2 98%   BMI 43.54 kg/m  BP Readings from Last 3 Encounters:  06/15/24 118/76  12/16/23 136/78  06/16/23 136/76    Wt Readings from Last 3 Encounters:  06/15/24 278 lb (126.1 kg)  12/16/23 282 lb 2 oz (128 kg)  10/25/23 270 lb (122.5 kg)   SpO2 Readings from Last 3 Encounters:  06/15/24 98%  12/16/23 98%  06/16/23 98%      Physical Exam Vitals and nursing note reviewed.  Constitutional:      Appearance: Normal appearance.   Cardiovascular:     Rate and Rhythm: Normal rate and regular rhythm.     Heart sounds: Normal heart sounds.  Pulmonary:     Effort: Pulmonary effort is normal.     Breath sounds: Normal breath sounds.  Abdominal:     Comments: BS WNL    Neurological:     Mental Status: She is alert.      No results found for any visits on 06/15/24.    The 10-year ASCVD risk score (Arnett DK, et al., 2019) is: 11.4%    Assessment & Plan:   Problem List Items Addressed This Visit       Cardiovascular and Mediastinum   Primary hypertension   Patient currently on lisinopril  10 mg, amlodipine  10 mg, metoprolol  50 twice daily.  Patient blood pressure well-controlled.  She is spotcheck blood the medication as prescribed.  Refills provided today  Relevant Medications   amLODipine  (NORVASC ) 5 MG tablet   lisinopril  (ZESTRIL ) 10 MG tablet   metoprolol  tartrate (LOPRESSOR ) 50 MG tablet   pravastatin  (PRAVACHOL ) 10 MG tablet     Other   Stress   Relevant Medications   busPIRone  (BUSPAR ) 10 MG tablet   Chronic pain of both knees   History of same.  Patient is using tramadol  50 mg twice daily as needed.  States she does not use it every day.  Continues medications prescribed no red flags in office.  This is helping patient be able to have a higher quality of life.  She recently was taking care of to ill family members.      Pure hypercholesterolemia   Relevant Medications   amLODipine  (NORVASC ) 5 MG tablet   lisinopril  (ZESTRIL ) 10 MG tablet   metoprolol  tartrate (LOPRESSOR ) 50 MG tablet   pravastatin  (PRAVACHOL ) 10 MG tablet    Return in about 6 months (around  12/15/2024) for CPE and Labs.    Margarie Shay, NP

## 2024-06-15 NOTE — Patient Instructions (Addendum)
 Nice to see you today I have refilled the medications as request Follow up with me in 6 months for physical and labs

## 2024-06-15 NOTE — Assessment & Plan Note (Signed)
 History of same.  Patient is using tramadol  50 mg twice daily as needed.  States she does not use it every day.  Continues medications prescribed no red flags in office.  This is helping patient be able to have a higher quality of life.  She recently was taking care of to ill family members.

## 2024-06-15 NOTE — Assessment & Plan Note (Signed)
 Patient currently on lisinopril  10 mg, amlodipine  10 mg, metoprolol  50 twice daily.  Patient blood pressure well-controlled.  She is spotcheck blood the medication as prescribed.  Refills provided today

## 2024-06-21 ENCOUNTER — Other Ambulatory Visit: Payer: Self-pay | Admitting: Nurse Practitioner

## 2024-06-21 DIAGNOSIS — E538 Deficiency of other specified B group vitamins: Secondary | ICD-10-CM

## 2024-06-21 DIAGNOSIS — G8929 Other chronic pain: Secondary | ICD-10-CM

## 2024-06-29 ENCOUNTER — Other Ambulatory Visit: Payer: Self-pay | Admitting: Nurse Practitioner

## 2024-06-29 DIAGNOSIS — H04123 Dry eye syndrome of bilateral lacrimal glands: Secondary | ICD-10-CM | POA: Diagnosis not present

## 2024-06-29 DIAGNOSIS — H52223 Regular astigmatism, bilateral: Secondary | ICD-10-CM | POA: Diagnosis not present

## 2024-06-29 DIAGNOSIS — H43813 Vitreous degeneration, bilateral: Secondary | ICD-10-CM | POA: Diagnosis not present

## 2024-06-29 DIAGNOSIS — F5101 Primary insomnia: Secondary | ICD-10-CM

## 2024-06-29 DIAGNOSIS — Z135 Encounter for screening for eye and ear disorders: Secondary | ICD-10-CM | POA: Diagnosis not present

## 2024-06-29 DIAGNOSIS — H524 Presbyopia: Secondary | ICD-10-CM | POA: Diagnosis not present

## 2024-07-13 DIAGNOSIS — B880 Other acariasis: Secondary | ICD-10-CM | POA: Diagnosis not present

## 2024-07-13 DIAGNOSIS — H0288B Meibomian gland dysfunction left eye, upper and lower eyelids: Secondary | ICD-10-CM | POA: Diagnosis not present

## 2024-07-13 DIAGNOSIS — H01009 Unspecified blepharitis unspecified eye, unspecified eyelid: Secondary | ICD-10-CM | POA: Diagnosis not present

## 2024-07-13 DIAGNOSIS — L718 Other rosacea: Secondary | ICD-10-CM | POA: Diagnosis not present

## 2024-07-13 DIAGNOSIS — H16223 Keratoconjunctivitis sicca, not specified as Sjogren's, bilateral: Secondary | ICD-10-CM | POA: Diagnosis not present

## 2024-07-13 DIAGNOSIS — H0288A Meibomian gland dysfunction right eye, upper and lower eyelids: Secondary | ICD-10-CM | POA: Diagnosis not present

## 2024-07-21 ENCOUNTER — Other Ambulatory Visit: Payer: Self-pay | Admitting: Nurse Practitioner

## 2024-07-21 DIAGNOSIS — G8929 Other chronic pain: Secondary | ICD-10-CM

## 2024-07-30 ENCOUNTER — Other Ambulatory Visit: Payer: Self-pay | Admitting: Nurse Practitioner

## 2024-07-30 DIAGNOSIS — F5101 Primary insomnia: Secondary | ICD-10-CM

## 2024-08-21 ENCOUNTER — Other Ambulatory Visit: Payer: Self-pay | Admitting: Nurse Practitioner

## 2024-08-21 DIAGNOSIS — G8929 Other chronic pain: Secondary | ICD-10-CM

## 2024-09-07 DIAGNOSIS — H0288B Meibomian gland dysfunction left eye, upper and lower eyelids: Secondary | ICD-10-CM | POA: Diagnosis not present

## 2024-09-07 DIAGNOSIS — H01009 Unspecified blepharitis unspecified eye, unspecified eyelid: Secondary | ICD-10-CM | POA: Diagnosis not present

## 2024-09-07 DIAGNOSIS — H524 Presbyopia: Secondary | ICD-10-CM | POA: Diagnosis not present

## 2024-09-07 DIAGNOSIS — H16223 Keratoconjunctivitis sicca, not specified as Sjogren's, bilateral: Secondary | ICD-10-CM | POA: Diagnosis not present

## 2024-09-07 DIAGNOSIS — L718 Other rosacea: Secondary | ICD-10-CM | POA: Diagnosis not present

## 2024-09-07 DIAGNOSIS — H0288A Meibomian gland dysfunction right eye, upper and lower eyelids: Secondary | ICD-10-CM | POA: Diagnosis not present

## 2024-09-07 DIAGNOSIS — B880 Other acariasis: Secondary | ICD-10-CM | POA: Diagnosis not present

## 2024-09-24 ENCOUNTER — Other Ambulatory Visit: Payer: Self-pay | Admitting: Family

## 2024-09-24 DIAGNOSIS — G8929 Other chronic pain: Secondary | ICD-10-CM

## 2024-10-02 NOTE — Telephone Encounter (Signed)
 Likely sent to [facility] since she covered for me. I have refilled the medication

## 2024-10-02 NOTE — Telephone Encounter (Unsigned)
 Copied from CRM #[id number]. Topic: Clinical - Medication Question >> Oct 02, 2024 11:33 AM Caroline Vazquez wrote: Reason for CRM: Patients medication for traMADol  (ULTRAM ) 50 MG tablet is still showing pending and request for it was sent on 9/28, patient would like a callback from someone explaining why medication has not been approved and is still in pending status

## 2024-10-25 ENCOUNTER — Ambulatory Visit: Payer: Medicare PPO

## 2024-10-25 VITALS — Ht 67.0 in | Wt 278.0 lb

## 2024-10-25 DIAGNOSIS — Z0000 Encounter for general adult medical examination without abnormal findings: Secondary | ICD-10-CM | POA: Diagnosis not present

## 2024-10-25 NOTE — Patient Instructions (Signed)
 Caroline Vazquez,  Thank you for taking the time for your Medicare Wellness Visit. I appreciate your continued commitment to your health goals. Please review the care plan we discussed, and feel free to reach out if I can assist you further.  Medicare recommends these wellness visits once per year to help you and your care team stay ahead of potential health issues. These visits are designed to focus on prevention, allowing your provider to concentrate on managing your acute and chronic conditions during your regular appointments.  Please note that Annual Wellness Visits do not include a physical exam. Some assessments may be limited, especially if the visit was conducted virtually. If needed, we may recommend a separate in-person follow-up with your provider.  Ongoing Care Seeing your primary care provider every 3 to 6 months helps us  monitor your health and provide consistent, personalized care.   Referrals If a referral was made during today's visit and you haven't received any updates within two weeks, please contact the referred provider directly to check on the status.  Recommended Screenings:  Health Maintenance  Topic Date Due   Hepatitis C Screening  Never done   DTaP/Tdap/Td vaccine (1 - Tdap) Never done   Breast Cancer Screening  Never done   Colon Cancer Screening  Never done   Zoster (Shingles) Vaccine (1 of 2) Never done   DEXA scan (bone density measurement)  Never done   Flu Shot  07/28/2024   COVID-19 Vaccine (4 - 2025-26 season) 08/28/2024   Medicare Annual Wellness Visit  10/25/2025   Pneumococcal Vaccine for age over 25  Completed   Meningitis B Vaccine  Aged Out       10/25/2023   11:05 AM  Advanced Directives  Does Patient Have a Medical Advance Directive? No  Would patient like information on creating a medical advance directive? Yes (MAU/Ambulatory/Procedural Areas - Information given)   Advance Care Planning is important because it: Ensures you receive medical  care that aligns with your values, goals, and preferences. Provides guidance to your family and loved ones, reducing the emotional burden of decision-making during critical moments.  Vision: Annual vision screenings are recommended for early detection of glaucoma, cataracts, and diabetic retinopathy. These exams can also reveal signs of chronic conditions such as diabetes and high blood pressure.  Dental: Annual dental screenings help detect early signs of oral cancer, gum disease, and other conditions linked to overall health, including heart disease and diabetes.

## 2024-10-25 NOTE — Progress Notes (Cosign Needed Addendum)
 Subjective:   Caroline Vazquez is a 73 y.o. who presents for a Medicare Wellness preventive visit.  As a reminder, Annual Wellness Visits don't include a physical exam, and some assessments may be limited, especially if this visit is performed virtually. We may recommend an in-person follow-up visit with your provider if needed.  Visit Complete: Virtual I connected with  Caroline Vazquez on 10/25/24 by a audio enabled telemedicine application and verified that I am speaking with the correct person using two identifiers.  Patient Location: Home  Provider Location: Office/Clinic  I discussed the limitations of evaluation and management by telemedicine. The patient expressed understanding and agreed to proceed.  Vital Signs: Because this visit was a virtual/telehealth visit, some criteria may be missing or patient reported. Any vitals not documented were not able to be obtained and vitals that have been documented are patient reported.  VideoDeclined- This patient declined Librarian, academic. Therefore the visit was completed with audio only.  Persons Participating in Visit: Patient.  AWV Questionnaire: Yes: Patient Medicare AWV questionnaire was completed by the patient on 10/21/24; I have confirmed that all information answered by patient is correct and no changes since this date.  Cardiac Risk Factors include: advanced age (>43men, >4 women);dyslipidemia;hypertension;obesity (BMI >30kg/m2);sedentary lifestyle     Objective:    Today's Vitals   10/21/24 1316 10/25/24 1306  Weight:  278 lb (126.1 kg)  Height:  5' 7 (1.702 m)  PainSc: 5     Body mass index is 43.54 kg/m.     10/25/2024    1:19 PM 10/25/2023   11:05 AM 08/25/2022   12:03 PM 07/20/2017    1:00 PM 07/07/2017    2:15 PM  Advanced Directives  Does Patient Have a Medical Advance Directive? No No No No  No   Would patient like information on creating a medical advance directive?  Yes  (MAU/Ambulatory/Procedural Areas - Information given) No - Patient declined No - Patient declined  No - Patient declined      Data saved with a previous flowsheet row definition    Current Medications (verified) Outpatient Encounter Medications as of 10/25/2024  Medication Sig   acetaminophen  (TYLENOL ) 500 MG tablet Take 1,000 mg by mouth every 6 (six) hours as needed (for pain.).   amLODipine  (NORVASC ) 5 MG tablet Take 1 tablet (5 mg total) by mouth daily.   busPIRone  (BUSPAR ) 10 MG tablet Take 1 tablet (10 mg total) by mouth 2 (two) times daily.   Cholecalciferol (VITAMIN D3) 50 MCG (2000 UT) capsule Take 2,000 Units by mouth daily.   cyanocobalamin  (VITAMIN B12) 1000 MCG/ML injection ADMINISTER 1 ML(1000 MCG) IN THE MUSCLE EVERY 30 DAYS   lisinopril  (ZESTRIL ) 10 MG tablet Take 1 tablet (10 mg total) by mouth daily.   metoprolol  tartrate (LOPRESSOR ) 50 MG tablet Take 1 tablet (50 mg total) by mouth 2 (two) times daily.   pravastatin  (PRAVACHOL ) 10 MG tablet Take 1 tablet (10 mg total) by mouth daily.   SYRINGE-NEEDLE, DISP, 3 ML 25G X 1 3 ML MISC 1 Application by Does not apply route as directed.   traMADol  (ULTRAM ) 50 MG tablet Take 1 tablet (50 mg total) by mouth 2 (two) times daily.   zolpidem  (AMBIEN ) 10 MG tablet TAKE 1 TABLET(10 MG) BY MOUTH AT BEDTIME   doxycycline  (VIBRA -TABS) 100 MG tablet Take 1 tablet (100 mg total) by mouth 2 (two) times daily. (Patient not taking: Reported on 10/25/2024)   No facility-administered encounter  medications on file as of 10/25/2024.    Allergies (verified) Penicillins, Nsaids, and Tolmetin   History: Past Medical History:  Diagnosis Date   Allergy As a child   Seasonal allergies   Anxiety    Arthritis    Cancer (HCC)    Right Kidney   Chronic kidney disease    Goiter, nodular    Hypertension    Past Surgical History:  Procedure Laterality Date   ABDOMINAL HYSTERECTOMY     ANTERIOR CERVICAL DECOMP/DISCECTOMY FUSION     BREAST  SURGERY Bilateral    CHOLECYSTECTOMY     EYE SURGERY  2020   JOINT REPLACEMENT     LAPAROSCOPIC NEPHRECTOMY Right 2014   [facility] [place]   LAPAROSCOPIC ROUX-EN-Y GASTRIC BYPASS WITH HIATAL HERNIA REPAIR  2005   SPINE SURGERY  2008?   Disk fusion   TONSILLECTOMY     TOTAL KNEE ARTHROPLASTY Left 07/20/2017   Procedure: TOTAL KNEE ARTHROPLASTY;  Surgeon: Kathlynn Sharper, MD;  Location: [facility] ORS;  Service: Orthopedics;  Laterality: Left;   Family History  Problem Relation Age of Onset   COPD Mother    Anxiety disorder Mother    Depression Mother    Diabetes Father    Hypertension Father    Kidney cancer Father    Heart disease Father    Aortic aneurysm Father    Kidney disease Father    Arthritis Father    Other Brother        brain tumor   Kidney disease Brother    Other Brother        valve replacement   Aortic aneurysm Brother        x 2   Kidney disease Brother    Cancer Brother    Heart attack Maternal Grandfather 15   Social History   Socioeconomic History   Marital status: Widowed    Spouse name: Not on file   Number of children: 1   Years of education: Not on file   Highest education level: Bachelor's degree (e.g., BA, AB, BS)  Occupational History   Not on file  Tobacco Use   Smoking status: Never   Smokeless tobacco: Never  Vaping Use   Vaping status: Never Used  Substance and Sexual Activity   Alcohol use: No   Drug use: No   Sexual activity: Not Currently    Birth control/protection: None  Other Topics Concern   Not on file  Social History Narrative   Retired      Runner, 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  (10/25/2024)   Overall Financial Resource Strain (CARDIA)    Difficulty of Paying Living Expenses: Not hard at all  Food Insecurity: No Food Insecurity (10/25/2024)   Hunger Vital Sign    Worried About Running Out of Food in the Last Year: Never true    Ran Out of Food in the Last Year:  Never true  Transportation Needs: No Transportation Needs (10/25/2024)   PRAPARE - Administrator, Civil Service (Medical): No    Lack of Transportation (Non-Medical): No  Physical Activity: Inactive (10/25/2024)   Exercise Vital Sign    Days of Exercise per Week: 0 days    Minutes of Exercise per Session: 0 min  Stress: Stress Concern Present (10/25/2024)   [employer] of Occupational Health - Occupational Stress Questionnaire    Feeling of Stress: To some extent  Social Connections: Moderately Isolated (10/25/2024)  Social Connection and Isolation Panel    Frequency of Communication with Friends and Family: More than three times a week    Frequency of Social Gatherings with Friends and Family: More than three times a week    Attends Religious Services: 1 to 4 times per year    Active Member of [employer] or Organizations: No    Attends Banker Meetings: Never    Marital Status: Widowed    Tobacco Counseling Counseling given: Not Answered   Clinical Intake:  Pre-visit preparation completed: Yes  Pain : 0-10 Pain Score: 5  Pain Type: Chronic pain Pain Location: Knee Pain Orientation: Left Pain Radiating Towards: hip Pain Descriptors / Indicators: Aching Pain Onset: More than a month ago Pain Frequency: Intermittent Pain Relieving Factors: tylenol ,tramadol  Effect of Pain on Daily Activities: less walking and activity  Pain Relieving Factors: tylenol ,tramadol  BMI - recorded: 43.54 Nutritional Status: BMI > 30  Obese Nutritional Risks: None Diabetes: No  Lab Results  Component Value Date   HGBA1C 6.4 12/16/2023   HGBA1C 6.3 12/15/2022   HGBA1C 6.4 09/21/2022     How often do you need to have someone help you when you read instructions, pamphlets, or other written materials from your doctor or pharmacy?: 1 - Never  Interpreter Needed?: No  Comments: lives alone Information entered by :: B.Kamoni Gentles,LPN   Activities of Daily Living in      10/21/2024    1:16 PM  In your present state of health, do you have any difficulty performing the following activities:  Hearing? 0  Vision? 0  Difficulty concentrating or making decisions? 0  Walking or climbing stairs? 0  Dressing or bathing? 0  Doing errands, shopping? 0  Preparing Food and eating ? N  Using the Toilet? N  In the past six months, have you accidently leaked urine? N  Do you have problems with loss of bowel control? N  Managing your Medications? N  Managing your Finances? N  Housekeeping or managing your Housekeeping? N    Patient Care Team: Wendee Lynwood HERO, NP as PCP - General (Nurse Practitioner) Roney, Jenna, OD as Referring Physician (Optometry)  I have updated your Care Teams any recent Medical Services you may have received from other providers in the past year.     Assessment:   This is a routine wellness examination for Caroline Vazquez.  Hearing/Vision screen Hearing Screening - Comments:: Patient denies any hearing difficulties.   Vision Screening - Comments:: Pt says their vision is good with glasses  Jenna Roney,MD-[facility]   Goals Addressed             This Visit's Progress    COMPLETED: DIET - EAT MORE FRUITS AND VEGETABLES       10/25/24     COMPLETED: Patient Stated        Loose weight Increase activity     Patient Stated       No new goals but main my lifeatyle       Depression Screen     10/25/2024    1:15 PM 06/15/2024    9:42 AM 12/16/2023    8:58 AM 10/25/2023   11:04 AM 04/08/2023    9:40 AM 08/25/2022   12:13 PM 06/15/2022   12:47 PM  PHQ 2/9 Scores  PHQ - 2 Score 0 0 0 0 2 0 0  PHQ- 9 Score  3 1  5       Fall Risk     10/21/2024  1:16 PM 06/15/2024    9:42 AM 12/16/2023    8:58 AM 10/25/2023   11:02 AM 04/08/2023    9:40 AM  Fall Risk   Falls in the past year? 1 0 1 0 1  Number falls in past yr: 0 0 0 0 0  Injury with Fall? 0 0 0 0 0  Risk for fall due to : Orthopedic patient No Fall Risks  No Fall Risks No  Fall Risks  Follow up Education provided;Falls prevention discussed Falls evaluation completed  Falls prevention discussed Falls evaluation completed    MEDICARE RISK AT HOME:  Medicare Risk at Home Any stairs in or around the home?: (Patient-Rptd) Yes If so, are there any without handrails?: (Patient-Rptd) No Home free of loose throw rugs in walkways, pet beds, electrical cords, etc?: (Patient-Rptd) Yes Adequate lighting in your home to reduce risk of falls?: (Patient-Rptd) Yes Life alert?: (Patient-Rptd) No Use of a cane, walker or w/c?: (Patient-Rptd) No Grab bars in the bathroom?: (Patient-Rptd) Yes Shower chair or bench in shower?: (Patient-Rptd) Yes Elevated toilet seat or a handicapped toilet?: (Patient-Rptd) No  TIMED UP AND GO:  Was the test performed?  No  Cognitive Function: 6CIT completed        10/25/2024    1:20 PM 10/25/2023   11:05 AM 08/25/2022   12:06 PM  6CIT Screen  What Year? 0 points 0 points 0 points  What month? 0 points 0 points 0 points  What time? 0 points 0 points 0 points  Count back from 20 0 points 0 points 0 points  Months in reverse 0 points 0 points 0 points  Repeat phrase 0 points 0 points 0 points  Total Score 0 points 0 points 0 points    Immunizations Immunization History  Administered Date(s) Administered   Fluad Quad(high Dose 65+) 12/15/2022   Fluad Trivalent(High Dose 65+) 12/16/2023   Moderna Covid-19 Vaccine Bivalent Booster 55yrs & up 01/04/2021   Moderna Sars-Covid-2 Vaccination 01/20/2020, 02/17/2020   PNEUMOCOCCAL CONJUGATE-20 12/16/2023    Screening Tests Health Maintenance  Topic Date Due   Hepatitis C Screening  Never done   DTaP/Tdap/Td (1 - Tdap) Never done   Mammogram  Never done   Colonoscopy  Never done   Zoster Vaccines- Shingrix (1 of 2) Never done   DEXA SCAN  Never done   Influenza Vaccine  07/28/2024   COVID-19 Vaccine (4 - 2025-26 season) 08/28/2024   Medicare Annual Wellness (AWV)  10/25/2025    Pneumococcal Vaccine: 50+ Years  Completed   Meningococcal B Vaccine  Aged Out    Health Maintenance Items Addressed: Pt just received Cologard in mail and will be sending in Scheduled in [facility] for vaccine next week Pt declines MMG, Covid vaccine  Additional Screening:  Vision Screening: Recommended annual ophthalmology exams for early detection of glaucoma and other disorders of the eye. Is the patient up to date with their annual eye exam?  Yes  Who is the provider or what is the name of the office in which the patient attends annual eye exams? Dr Royetta  Dental Screening: Recommended annual dental exams for proper oral hygiene  Community Resource Referral / Chronic Care Management: CRR required this visit?  No   CCM required this visit?  No   Plan:    I have personally reviewed and noted the following in the patient's chart:   Medical and social history Use of alcohol, tobacco or illicit drugs  Current medications and supplements  including opioid prescriptions. Patient is currently taking opioid prescriptions. Information provided to patient regarding non-opioid alternatives. Patient advised to discuss non-opioid treatment plan with their provider. Functional ability and status Nutritional status Physical activity Advanced directives List of other physicians Hospitalizations, surgeries, and ER visits in previous 12 months Vitals Screenings to include cognitive, depression, and falls Referrals and appointments  In addition, I have reviewed and discussed with patient certain preventive protocols, quality metrics, and best practice recommendations. A written personalized care plan for preventive services as well as general preventive health recommendations were provided to patient.   Caroline LITTIE Saris, LPN   [id number]   After Visit Summary: (MyChart) Due to this being a telephonic visit, the after visit summary with patients personalized plan was offered to patient  via MyChart   Notes: Nothing significant to report at this time.

## 2024-10-26 ENCOUNTER — Other Ambulatory Visit: Payer: Self-pay | Admitting: Nurse Practitioner

## 2024-10-26 DIAGNOSIS — F5101 Primary insomnia: Secondary | ICD-10-CM

## 2024-10-31 ENCOUNTER — Ambulatory Visit

## 2024-11-02 ENCOUNTER — Other Ambulatory Visit: Payer: Self-pay | Admitting: Nurse Practitioner

## 2024-11-02 DIAGNOSIS — G8929 Other chronic pain: Secondary | ICD-10-CM

## 2024-12-11 ENCOUNTER — Other Ambulatory Visit: Payer: Self-pay | Admitting: Nurse Practitioner

## 2024-12-11 DIAGNOSIS — G8929 Other chronic pain: Secondary | ICD-10-CM

## 2024-12-25 ENCOUNTER — Encounter: Admitting: Nurse Practitioner

## 2025-01-14 ENCOUNTER — Other Ambulatory Visit: Payer: Self-pay | Admitting: Nurse Practitioner

## 2025-01-14 DIAGNOSIS — E538 Deficiency of other specified B group vitamins: Secondary | ICD-10-CM

## 2025-01-14 DIAGNOSIS — G8929 Other chronic pain: Secondary | ICD-10-CM

## 2025-01-24 ENCOUNTER — Other Ambulatory Visit: Payer: Self-pay | Admitting: Nurse Practitioner

## 2025-01-24 DIAGNOSIS — F5101 Primary insomnia: Secondary | ICD-10-CM

## 2025-01-31 ENCOUNTER — Encounter: Admitting: Nurse Practitioner

## 2025-03-08 ENCOUNTER — Encounter: Admitting: Nurse Practitioner

## 2025-10-30 ENCOUNTER — Ambulatory Visit
